# Patient Record
Sex: Female | Born: 1940 | Race: Black or African American | Hispanic: No | Marital: Single | Smoking: Never smoker
Health system: Southern US, Community
[De-identification: ages and names within clinical notes are randomized; demographics above are authoritative.]

## PROBLEM LIST (undated history)

## (undated) DIAGNOSIS — R109 Unspecified abdominal pain: Secondary | ICD-10-CM

## (undated) DIAGNOSIS — K649 Unspecified hemorrhoids: Secondary | ICD-10-CM

## (undated) DIAGNOSIS — F0390 Unspecified dementia without behavioral disturbance: Secondary | ICD-10-CM

## (undated) DIAGNOSIS — K219 Gastro-esophageal reflux disease without esophagitis: Secondary | ICD-10-CM

## (undated) DIAGNOSIS — E119 Type 2 diabetes mellitus without complications: Secondary | ICD-10-CM

## (undated) DIAGNOSIS — C801 Malignant (primary) neoplasm, unspecified: Secondary | ICD-10-CM

## (undated) DIAGNOSIS — D649 Anemia, unspecified: Secondary | ICD-10-CM

## (undated) DIAGNOSIS — I639 Cerebral infarction, unspecified: Secondary | ICD-10-CM

## (undated) HISTORY — PX: TOTAL ABDOMINAL HYSTERECTOMY: SHX209

## (undated) HISTORY — DX: Unspecified hemorrhoids: K64.9

## (undated) HISTORY — PX: OTHER SURGICAL HISTORY: SHX169

## (undated) HISTORY — DX: Gastro-esophageal reflux disease without esophagitis: K21.9

## (undated) HISTORY — DX: Unspecified dementia, unspecified severity, without behavioral disturbance, psychotic disturbance, mood disturbance, and anxiety: F03.90

## (undated) HISTORY — PX: CHOLECYSTECTOMY: SHX55

## (undated) HISTORY — DX: Type 2 diabetes mellitus without complications: E11.9

## (undated) HISTORY — DX: Cerebral infarction, unspecified: I63.9

## (undated) HISTORY — DX: Unspecified abdominal pain: R10.9

## (undated) HISTORY — DX: Anemia, unspecified: D64.9

## (undated) HISTORY — DX: Malignant (primary) neoplasm, unspecified: C80.1

---

## 2003-12-26 ENCOUNTER — Ambulatory Visit: Payer: Self-pay | Admitting: Urology

## 2004-02-09 ENCOUNTER — Other Ambulatory Visit: Payer: Self-pay

## 2004-02-09 ENCOUNTER — Inpatient Hospital Stay: Payer: Self-pay | Admitting: Internal Medicine

## 2004-02-10 ENCOUNTER — Other Ambulatory Visit: Payer: Self-pay

## 2004-05-14 ENCOUNTER — Ambulatory Visit: Payer: Self-pay | Admitting: Urology

## 2004-08-15 ENCOUNTER — Ambulatory Visit: Payer: Self-pay | Admitting: Internal Medicine

## 2004-08-24 ENCOUNTER — Ambulatory Visit: Payer: Self-pay | Admitting: Internal Medicine

## 2004-11-08 ENCOUNTER — Ambulatory Visit: Payer: Self-pay | Admitting: Urology

## 2005-01-03 ENCOUNTER — Ambulatory Visit: Payer: Self-pay | Admitting: Internal Medicine

## 2005-01-16 ENCOUNTER — Ambulatory Visit: Payer: Self-pay | Admitting: Internal Medicine

## 2005-06-10 ENCOUNTER — Ambulatory Visit: Payer: Self-pay | Admitting: Urology

## 2006-01-28 ENCOUNTER — Ambulatory Visit: Payer: Self-pay

## 2006-08-01 ENCOUNTER — Ambulatory Visit: Payer: Self-pay

## 2006-08-21 ENCOUNTER — Ambulatory Visit: Payer: Self-pay | Admitting: Urology

## 2006-09-04 ENCOUNTER — Ambulatory Visit: Payer: Self-pay | Admitting: Urology

## 2006-10-14 ENCOUNTER — Ambulatory Visit: Payer: Self-pay | Admitting: Urology

## 2006-10-14 ENCOUNTER — Other Ambulatory Visit: Payer: Self-pay

## 2006-11-04 ENCOUNTER — Ambulatory Visit: Payer: Self-pay | Admitting: Urology

## 2006-12-29 ENCOUNTER — Ambulatory Visit: Payer: Self-pay | Admitting: Urology

## 2007-04-08 ENCOUNTER — Ambulatory Visit: Payer: Self-pay | Admitting: Urology

## 2007-10-14 ENCOUNTER — Ambulatory Visit: Payer: Self-pay | Admitting: Urology

## 2008-04-13 ENCOUNTER — Ambulatory Visit: Payer: Self-pay | Admitting: Urology

## 2008-11-07 ENCOUNTER — Ambulatory Visit: Payer: Self-pay | Admitting: Urology

## 2009-05-16 ENCOUNTER — Ambulatory Visit: Payer: Self-pay | Admitting: Internal Medicine

## 2009-05-17 ENCOUNTER — Ambulatory Visit: Payer: Self-pay | Admitting: Urology

## 2009-05-20 ENCOUNTER — Inpatient Hospital Stay: Payer: Self-pay | Admitting: Internal Medicine

## 2009-06-08 ENCOUNTER — Ambulatory Visit: Payer: Self-pay | Admitting: Internal Medicine

## 2009-06-16 ENCOUNTER — Ambulatory Visit: Payer: Self-pay | Admitting: Internal Medicine

## 2009-08-08 ENCOUNTER — Ambulatory Visit: Payer: Self-pay | Admitting: Internal Medicine

## 2009-08-16 ENCOUNTER — Ambulatory Visit: Payer: Self-pay | Admitting: Internal Medicine

## 2009-08-17 ENCOUNTER — Ambulatory Visit: Payer: Self-pay | Admitting: Internal Medicine

## 2009-09-15 ENCOUNTER — Ambulatory Visit: Payer: Self-pay | Admitting: Internal Medicine

## 2009-12-16 ENCOUNTER — Ambulatory Visit: Payer: Self-pay | Admitting: Internal Medicine

## 2009-12-28 ENCOUNTER — Ambulatory Visit: Payer: Self-pay | Admitting: Internal Medicine

## 2010-01-16 ENCOUNTER — Ambulatory Visit: Payer: Self-pay | Admitting: Internal Medicine

## 2010-02-20 ENCOUNTER — Ambulatory Visit: Payer: Self-pay | Admitting: Unknown Physician Specialty

## 2010-02-22 LAB — PATHOLOGY REPORT

## 2010-03-29 ENCOUNTER — Ambulatory Visit: Payer: Self-pay | Admitting: Internal Medicine

## 2010-04-18 ENCOUNTER — Ambulatory Visit: Payer: Self-pay | Admitting: Internal Medicine

## 2010-06-28 ENCOUNTER — Ambulatory Visit: Payer: Self-pay | Admitting: Internal Medicine

## 2010-07-17 ENCOUNTER — Ambulatory Visit: Payer: Self-pay

## 2010-07-17 ENCOUNTER — Ambulatory Visit: Payer: Self-pay | Admitting: Internal Medicine

## 2010-09-13 ENCOUNTER — Ambulatory Visit: Payer: Self-pay | Admitting: Internal Medicine

## 2010-09-16 ENCOUNTER — Ambulatory Visit: Payer: Self-pay | Admitting: Internal Medicine

## 2010-12-13 ENCOUNTER — Ambulatory Visit: Payer: Self-pay | Admitting: Internal Medicine

## 2010-12-17 ENCOUNTER — Ambulatory Visit: Payer: Self-pay | Admitting: Internal Medicine

## 2011-02-28 ENCOUNTER — Ambulatory Visit: Payer: Self-pay | Admitting: Internal Medicine

## 2011-03-11 ENCOUNTER — Ambulatory Visit: Payer: Self-pay

## 2011-03-19 ENCOUNTER — Ambulatory Visit: Payer: Self-pay | Admitting: Internal Medicine

## 2011-08-15 ENCOUNTER — Ambulatory Visit: Payer: Self-pay | Admitting: Internal Medicine

## 2011-08-15 LAB — IRON AND TIBC
Iron Bind.Cap.(Total): 264 ug/dL (ref 250–450)
Iron: 79 ug/dL (ref 50–170)
Unbound Iron-Bind.Cap.: 185 ug/dL

## 2011-08-15 LAB — CBC CANCER CENTER
Basophil %: 0.9 %
Eosinophil #: 0.5 x10 3/mm (ref 0.0–0.7)
Eosinophil %: 9.2 %
HGB: 11.9 g/dL — ABNORMAL LOW (ref 12.0–16.0)
MCH: 30.4 pg (ref 26.0–34.0)
MCHC: 32.3 g/dL (ref 32.0–36.0)
MCV: 94 fL (ref 80–100)
Monocyte #: 0.5 x10 3/mm (ref 0.2–0.9)
RBC: 3.91 10*6/uL (ref 3.80–5.20)
WBC: 5.3 x10 3/mm (ref 3.6–11.0)

## 2011-08-17 ENCOUNTER — Ambulatory Visit: Payer: Self-pay | Admitting: Internal Medicine

## 2011-10-28 ENCOUNTER — Ambulatory Visit: Payer: Self-pay | Admitting: Internal Medicine

## 2011-10-28 LAB — CBC CANCER CENTER
Basophil #: 0.1 x10 3/mm (ref 0.0–0.1)
Eosinophil #: 0.8 x10 3/mm — ABNORMAL HIGH (ref 0.0–0.7)
HCT: 34.3 % — ABNORMAL LOW (ref 35.0–47.0)
Lymphocyte #: 2 x10 3/mm (ref 1.0–3.6)
Lymphocyte %: 37.1 %
MCHC: 32.8 g/dL (ref 32.0–36.0)
MCV: 95 fL (ref 80–100)
Monocyte #: 0.4 x10 3/mm (ref 0.2–0.9)
Neutrophil #: 2.2 x10 3/mm (ref 1.4–6.5)
Neutrophil %: 39.6 %
Platelet: 205 x10 3/mm (ref 150–440)
RDW: 13.2 % (ref 11.5–14.5)

## 2011-10-28 LAB — IRON AND TIBC
Iron Bind.Cap.(Total): 272 ug/dL (ref 250–450)
Iron Saturation: 27 %
Unbound Iron-Bind.Cap.: 199 ug/dL

## 2011-11-17 ENCOUNTER — Ambulatory Visit: Payer: Self-pay | Admitting: Internal Medicine

## 2012-02-08 ENCOUNTER — Emergency Department: Payer: Self-pay | Admitting: Emergency Medicine

## 2012-02-08 LAB — COMPREHENSIVE METABOLIC PANEL
Albumin: 3.6 g/dL (ref 3.4–5.0)
Alkaline Phosphatase: 69 U/L (ref 50–136)
BUN: 19 mg/dL — ABNORMAL HIGH (ref 7–18)
Calcium, Total: 9.2 mg/dL (ref 8.5–10.1)
Co2: 26 mmol/L (ref 21–32)
EGFR (Non-African Amer.): 41 — ABNORMAL LOW
Glucose: 148 mg/dL — ABNORMAL HIGH (ref 65–99)
SGOT(AST): 16 U/L (ref 15–37)
SGPT (ALT): 14 U/L (ref 12–78)
Sodium: 141 mmol/L (ref 136–145)

## 2012-02-08 LAB — CBC
HCT: 36.3 % (ref 35.0–47.0)
MCV: 93 fL (ref 80–100)
Platelet: 215 10*3/uL (ref 150–440)
RBC: 3.88 10*6/uL (ref 3.80–5.20)
WBC: 6.4 10*3/uL (ref 3.6–11.0)

## 2012-02-09 LAB — CK: CK, Total: 89 U/L (ref 21–215)

## 2012-03-18 DIAGNOSIS — K649 Unspecified hemorrhoids: Secondary | ICD-10-CM

## 2012-03-18 HISTORY — DX: Unspecified hemorrhoids: K64.9

## 2012-03-19 ENCOUNTER — Ambulatory Visit: Payer: Self-pay | Admitting: Internal Medicine

## 2012-03-19 LAB — IRON AND TIBC
Iron: 58 ug/dL (ref 50–170)
Unbound Iron-Bind.Cap.: 218 ug/dL

## 2012-03-19 LAB — CBC CANCER CENTER
Basophil %: 1.7 %
Eosinophil %: 14.9 %
Lymphocyte #: 1.3 x10 3/mm (ref 1.0–3.6)
MCH: 30.2 pg (ref 26.0–34.0)
MCHC: 32.7 g/dL (ref 32.0–36.0)
MCV: 92 fL (ref 80–100)
Monocyte #: 0.5 x10 3/mm (ref 0.2–0.9)
Monocyte %: 9 %
Platelet: 221 x10 3/mm (ref 150–440)
RBC: 3.89 10*6/uL (ref 3.80–5.20)
WBC: 5.3 x10 3/mm (ref 3.6–11.0)

## 2012-03-19 LAB — FERRITIN: Ferritin (ARMC): 57 ng/mL (ref 8–388)

## 2012-04-17 ENCOUNTER — Ambulatory Visit: Payer: Self-pay | Admitting: Internal Medicine

## 2012-04-17 LAB — CREATININE, SERUM
Creatinine: 1.24 mg/dL (ref 0.60–1.30)
EGFR (African American): 51 — ABNORMAL LOW
EGFR (Non-African Amer.): 44 — ABNORMAL LOW

## 2012-04-18 ENCOUNTER — Ambulatory Visit: Payer: Self-pay | Admitting: Internal Medicine

## 2012-05-11 ENCOUNTER — Emergency Department: Payer: Self-pay | Admitting: Emergency Medicine

## 2012-05-11 LAB — COMPREHENSIVE METABOLIC PANEL
Albumin: 3.4 g/dL (ref 3.4–5.0)
Anion Gap: 7 (ref 7–16)
BUN: 14 mg/dL (ref 7–18)
Co2: 28 mmol/L (ref 21–32)
Creatinine: 0.87 mg/dL (ref 0.60–1.30)
Glucose: 91 mg/dL (ref 65–99)
Osmolality: 278 (ref 275–301)
Potassium: 3.9 mmol/L (ref 3.5–5.1)
SGOT(AST): 14 U/L — ABNORMAL LOW (ref 15–37)
Sodium: 139 mmol/L (ref 136–145)

## 2012-05-11 LAB — TROPONIN I: Troponin-I: <0.02 ng/mL

## 2012-05-11 LAB — URINALYSIS, COMPLETE
Bilirubin,UR: NEGATIVE
Ketone: NEGATIVE
Nitrite: NEGATIVE
Protein: NEGATIVE

## 2012-05-11 LAB — CBC
HCT: 36.7 % (ref 35.0–47.0)
MCH: 29.4 pg (ref 26.0–34.0)
MCHC: 31.9 g/dL — ABNORMAL LOW (ref 32.0–36.0)
Platelet: 249 10*3/uL (ref 150–440)
RDW: 12.9 % (ref 11.5–14.5)
WBC: 5.6 10*3/uL (ref 3.6–11.0)

## 2012-05-11 LAB — CK TOTAL AND CKMB (NOT AT ARMC): CK-MB: <0.5 ng/mL — ABNORMAL LOW (ref 0.5–3.6)

## 2012-05-12 ENCOUNTER — Other Ambulatory Visit: Payer: Self-pay | Admitting: Family

## 2012-05-12 LAB — SEDIMENTATION RATE: Erythrocyte Sed Rate: 6 mm/hr (ref 0–30)

## 2012-05-13 LAB — URINE CULTURE

## 2012-05-20 ENCOUNTER — Ambulatory Visit: Payer: Self-pay | Admitting: Internal Medicine

## 2012-08-20 ENCOUNTER — Ambulatory Visit: Payer: Self-pay | Admitting: Internal Medicine

## 2012-09-15 ENCOUNTER — Ambulatory Visit: Payer: Self-pay | Admitting: Internal Medicine

## 2012-10-06 LAB — CBC CANCER CENTER
Basophil #: 0.1 x10 3/mm (ref 0.0–0.1)
Basophil %: 1.1 %
Eosinophil #: 0.7 x10 3/mm (ref 0.0–0.7)
Eosinophil %: 13.6 %
HGB: 12.4 g/dL (ref 12.0–16.0)
Lymphocyte #: 1.8 x10 3/mm (ref 1.0–3.6)
MCH: 30.8 pg (ref 26.0–34.0)
MCHC: 33.4 g/dL (ref 32.0–36.0)
Neutrophil %: 39.4 %
RDW: 13.8 % (ref 11.5–14.5)

## 2012-10-06 LAB — IRON AND TIBC: Iron Bind.Cap.(Total): 325 ug/dL (ref 250–450)

## 2012-10-16 ENCOUNTER — Ambulatory Visit: Payer: Self-pay | Admitting: Internal Medicine

## 2012-11-02 ENCOUNTER — Encounter: Payer: Self-pay | Admitting: *Deleted

## 2012-11-11 ENCOUNTER — Encounter: Payer: Self-pay | Admitting: General Surgery

## 2012-11-11 ENCOUNTER — Ambulatory Visit (INDEPENDENT_AMBULATORY_CARE_PROVIDER_SITE_OTHER): Payer: Medicare PPO | Admitting: General Surgery

## 2012-11-11 VITALS — BP 130/80 | HR 64 | Resp 12 | Ht 67.0 in | Wt 211.0 lb

## 2012-11-11 DIAGNOSIS — K6289 Other specified diseases of anus and rectum: Secondary | ICD-10-CM | POA: Insufficient documentation

## 2012-11-11 DIAGNOSIS — K439 Ventral hernia without obstruction or gangrene: Secondary | ICD-10-CM

## 2012-11-11 DIAGNOSIS — R102 Pelvic and perineal pain: Secondary | ICD-10-CM

## 2012-11-11 DIAGNOSIS — K649 Unspecified hemorrhoids: Secondary | ICD-10-CM | POA: Insufficient documentation

## 2012-11-11 DIAGNOSIS — N949 Unspecified condition associated with female genital organs and menstrual cycle: Secondary | ICD-10-CM

## 2012-11-11 NOTE — Progress Notes (Signed)
Patient ID: Michele Abbott, female   DOB: Jan 06, 1941, 72 y.o.   MRN: [id number]  Chief Complaint  Patient presents with  . Other    evaluation of hemorrhoids    HPI Michele Abbott is a 72 y.o. female.  Patient here for hemorrhoid evaluation.  States she is having increased rectal pain over past month. Throbbing pain that is constant. Using Tylenol but not helping. No bleeding with BM.  States BM 2-3 times a day.  Since her last visit in February 2014 she has been started on Coumadin for atrial fibrillation and stroke prevention. The patient reports being unaware of any cardiac problems.  The patient denies pain in the vagina or dysuria.  She is accompanied today by her younger sister, Michele Abbott. HPI  Past Medical History  Diagnosis Date  . Hemorrhoid 2014  . GERD (gastroesophageal reflux disease)   . Diabetes mellitus without complication   . Stroke   . Dementia   . Anemia   . Abdominal pain     Past Surgical History  Procedure Laterality Date  . Total abdominal hysterectomy    . Cholecystectomy      Family History  Problem Relation Age of Onset  . Cancer Sister     ovarian    Social History History  Substance Use Topics  . Smoking status: Never Smoker   . Smokeless tobacco: Not on file  . Alcohol Use: No    Allergies  Allergen Reactions  . Codeine Other (See Comments)    shakey    Current Outpatient Prescriptions  Medication Sig Dispense Refill  . aluminum & magnesium hydroxide-simethicone (MYLANTA) 500-450-40 MG/5ML suspension Take by mouth as needed for indigestion.      . Cholecalciferol (VITAMIN D-3) 1000 UNITS CAPS Take by mouth daily.      . Ferrous Sulfate (IRON) 325 (65 FE) MG TABS Take by mouth daily.      Marland Kitchen olmesartan (BENICAR) 40 MG tablet Take 40 mg by mouth daily.      . potassium chloride (MICRO-K) 10 MEQ CR capsule Take 10 mEq by mouth daily.      . baclofen (LIORESAL) 10 MG tablet Take 5 mg by mouth daily.       Marland Kitchen donepezil (ARICEPT) 5 MG tablet  Take 5 mg by mouth at bedtime.       . metFORMIN (GLUCOPHAGE) 1000 MG tablet 1,000 mg 2 (two) times daily with a meal.       . PRODIGY NO CODING BLOOD GLUC test strip       . ranitidine (ZANTAC) 150 MG tablet 150 mg 2 (two) times daily.       . simvastatin (ZOCOR) 20 MG tablet Take 20 mg by mouth daily.       . traMADol (ULTRAM) 50 MG tablet 50 mg 2 (two) times daily.       Marland Kitchen warfarin (COUMADIN) 5 MG tablet Take 5 mg by mouth daily.        No current facility-administered medications for this visit.    Review of Systems Review of Systems  Constitutional: Negative.   Respiratory: Negative.   Gastrointestinal: Negative.   Neurological: Positive for headaches.    Blood pressure 130/80, pulse 64, resp. rate 12, height 5\' 7"  (1.702 m), weight 211 lb (95.709 kg).  Physical Exam Physical Exam  Constitutional: She is oriented to person, place, and time. She appears well-developed and well-nourished.  Cardiovascular: Normal rate and regular rhythm.   Pulses:      Dorsalis pedis  pulses are 2+ on the right side, and 2+ on the left side.  Pulmonary/Chest: Effort normal and breath sounds normal.  Abdominal: Soft. A hernia is present. Hernia confirmed positive in the ventral area.  Genitourinary: Rectum normal. Rectal exam shows anal tone normal.  Normal sphincter tone appreciated. No pathology of the puborectalis muscle. No point tenderness. No spasm.  Neurological: She is alert and oriented to person, place, and time.  Skin: Skin is warm and dry.  Ventral hernia is perhaps 4 or 5 cm in diameter when examined in the standing position, less prominent when supine.  Data Reviewed 2011 CT scan: No pelvic pathology identified.  Anoscopy was completed today. The visualized lower rectal mucosa was normal. No internal or external hemorrhoids appreciated. Previously suspected fissure no longer evident.  Assessment    Persistent pelvic pain.  Ventral hernia.     Plan    Arranges we made for  a CT scan of the abdomen and pelvis to assess both the hernia (unlikely source for her present symptoms) and the pelvis to assess for source of her reported pain. At this time, no pathologic process as indicated.     Patient has been scheduled for a CT abdomen/pelvis with contrast at [facility] for 11-17-12 at 3:30 pm (arrive 3 pm). Prep: no solids 4 hours prior but patient may have clear liquids up until exam time, pick up prep kit, and take medication list. She will not make use of metformin day of scan and hold 48 hours after. Patient verbalizes understanding.  Patient to follow up in the office after CT scan completed.   Michele Abbott 11/11/2012, 10:10 PM

## 2012-11-11 NOTE — Patient Instructions (Addendum)
Patient has been scheduled for a CT abdomen/pelvis with contrast at [facility] for 11-17-12 at 3:30 pm (arrive 3 pm). Prep: no solids 4 hours prior but patient may have clear liquids up until exam time, pick up prep kit, and take medication list. She will not make use of metformin day of scan and hold 48 hours after. Patient verbalizes understanding.  Patient to follow up in the office after CT scan completed.

## 2012-11-17 ENCOUNTER — Other Ambulatory Visit: Payer: Self-pay

## 2012-11-17 ENCOUNTER — Ambulatory Visit: Payer: Self-pay | Admitting: General Surgery

## 2012-11-17 DIAGNOSIS — K439 Ventral hernia without obstruction or gangrene: Secondary | ICD-10-CM

## 2012-11-18 ENCOUNTER — Encounter: Payer: Self-pay | Admitting: General Surgery

## 2012-11-23 ENCOUNTER — Encounter: Payer: Self-pay | Admitting: General Surgery

## 2012-11-23 ENCOUNTER — Ambulatory Visit (INDEPENDENT_AMBULATORY_CARE_PROVIDER_SITE_OTHER): Payer: Medicare PPO | Admitting: General Surgery

## 2012-11-23 VITALS — BP 130/90 | HR 64 | Resp 16 | Ht 67.0 in | Wt 216.0 lb

## 2012-11-23 DIAGNOSIS — R102 Pelvic and perineal pain: Secondary | ICD-10-CM

## 2012-11-23 DIAGNOSIS — N949 Unspecified condition associated with female genital organs and menstrual cycle: Secondary | ICD-10-CM

## 2012-11-23 DIAGNOSIS — K6289 Other specified diseases of anus and rectum: Secondary | ICD-10-CM

## 2012-11-23 NOTE — Progress Notes (Signed)
Patient ID: Michele Abbott, female   DOB: [birth year], 72 y.o.   MRN: [id number]  Chief Complaint  Patient presents with  . Follow-up    CT abd/pelvis     HPI Michele Abbott is a 72 y.o. female who presents for a follow up CT of the abdomen/pelvis. The CT was done on 11/17/12 at [facility].  The patient continues to report pain pneumoperitoneum. No bleeding. She reports he she is not experiencing pain in the left thigh. The patient is accompanied today by her sister who is present for the interview and exam. HPI  Past Medical History  Diagnosis Date  . Hemorrhoid 2014  . GERD (gastroesophageal reflux disease)   . Diabetes mellitus without complication   . Stroke   . Dementia   . Anemia   . Abdominal pain   . Cancer 2008-2009?    kidney    Past Surgical History  Procedure Laterality Date  . Total abdominal hysterectomy    . Cholecystectomy    . Cyrotherapy      Kidney    Family History  Problem Relation Age of Onset  . Cancer Sister     ovarian    Social History History  Substance Use Topics  . Smoking status: Never Smoker   . Smokeless tobacco: Not on file  . Alcohol Use: No    Allergies  Allergen Reactions  . Codeine Other (See Comments)    shakey    Current Outpatient Prescriptions  Medication Sig Dispense Refill  . aluminum & magnesium hydroxide-simethicone (MYLANTA) 500-450-40 MG/5ML suspension Take by mouth as needed for indigestion.      . baclofen (LIORESAL) 10 MG tablet Take 5 mg by mouth daily.       . Cholecalciferol (VITAMIN D-3) 1000 UNITS CAPS Take by mouth daily.      Marland Kitchen donepezil (ARICEPT) 5 MG tablet Take 5 mg by mouth at bedtime.       . Ferrous Sulfate (IRON) 325 (65 FE) MG TABS Take by mouth daily.      . metFORMIN (GLUCOPHAGE) 1000 MG tablet 1,000 mg 2 (two) times daily with a meal.       . olmesartan (BENICAR) 40 MG tablet Take 40 mg by mouth daily.      . potassium chloride (MICRO-K) 10 MEQ CR capsule Take 10 mEq by mouth daily.      Marland Kitchen PRODIGY  NO CODING BLOOD GLUC test strip       . ranitidine (ZANTAC) 150 MG tablet 150 mg 2 (two) times daily.       . simvastatin (ZOCOR) 20 MG tablet Take 20 mg by mouth daily.       . traMADol (ULTRAM) 50 MG tablet 50 mg 2 (two) times daily.       Marland Kitchen warfarin (COUMADIN) 5 MG tablet Take 5 mg by mouth daily.        No current facility-administered medications for this visit.    Review of Systems Review of Systems  Constitutional: Negative.   Respiratory: Negative.   Cardiovascular: Negative.   Gastrointestinal: Negative.     Blood pressure 130/90, pulse 64, resp. rate 16, height 5\' 7"  (1.702 m), weight 216 lb (97.977 kg).  Physical Exam Physical Exam  Constitutional: She is oriented to person, place, and time. She appears well-developed and well-nourished.  Abdominal: Soft. Normal appearance.    Neurological: She is alert and oriented to person, place, and time.  Skin: Skin is warm and dry.     Data Reviewed  CT of the abdomen and pelvis dated 11/17/2012 reviewed. Findings consistent for polycystic kidney disease. Possible residual malignancy is possible. Large ventral hernia without evidence of dilatation or obstruction. Special attention to the pelvis showed no discernible abnormality of the muscular structure, no lymphadenopathy.  Assessment    Pelvic pain, no clear etiology.  Asymptomatic ventral hernia.  Questionable interval change on CT status post cryotherapy of a right renal cancer.    Plan    The patient had previously been treated by Edrick Oh, M.D. She will take responsibility for ranging a followup assessment.  In regards to her pelvic symptoms no additional recommendations are available this time, as no pathology is identified.  Her ventral hernia is long-standing asymptomatic. No intervention is required at this time.       [name] 11/24/2012, 7:48 AM

## 2012-11-23 NOTE — Patient Instructions (Signed)
Patient to return as needed. 

## 2012-11-24 ENCOUNTER — Ambulatory Visit: Payer: Self-pay | Admitting: Nurse Practitioner

## 2012-12-22 ENCOUNTER — Ambulatory Visit: Payer: Self-pay | Admitting: Internal Medicine

## 2013-05-26 ENCOUNTER — Emergency Department: Payer: Self-pay | Admitting: Emergency Medicine

## 2013-05-26 LAB — COMPREHENSIVE METABOLIC PANEL
ALBUMIN: 3.3 g/dL — AB (ref 3.4–5.0)
ALT: 9 U/L — AB (ref 12–78)
ANION GAP: 7 (ref 7–16)
AST: 5 U/L — AB (ref 15–37)
Alkaline Phosphatase: 72 U/L
BUN: 14 mg/dL (ref 7–18)
Bilirubin,Total: 0.5 mg/dL (ref 0.2–1.0)
CALCIUM: 9.6 mg/dL (ref 8.5–10.1)
CREATININE: 1.08 mg/dL (ref 0.60–1.30)
Chloride: 104 mmol/L (ref 98–107)
Co2: 29 mmol/L (ref 21–32)
EGFR (African American): 60 — ABNORMAL LOW
GFR CALC NON AF AMER: 52 — AB
GLUCOSE: 139 mg/dL — AB (ref 65–99)
Osmolality: 282 (ref 275–301)
POTASSIUM: 4.2 mmol/L (ref 3.5–5.1)
SODIUM: 140 mmol/L (ref 136–145)
Total Protein: 6.7 g/dL (ref 6.4–8.2)

## 2013-05-26 LAB — URINALYSIS, COMPLETE
Bacteria: NONE SEEN
Bilirubin,UR: NEGATIVE
GLUCOSE, UR: NEGATIVE mg/dL (ref 0–75)
Ketone: NEGATIVE
Nitrite: NEGATIVE
Ph: 7 (ref 4.5–8.0)
SPECIFIC GRAVITY: 1.015 (ref 1.003–1.030)
Squamous Epithelial: <1
WBC UR: 48 /HPF (ref 0–5)

## 2013-05-26 LAB — TROPONIN I: Troponin-I: <0.02 ng/mL

## 2013-05-26 LAB — CBC
HCT: 36.8 % (ref 35.0–47.0)
HGB: 11.7 g/dL — AB (ref 12.0–16.0)
MCH: 30.3 pg (ref 26.0–34.0)
MCHC: 31.7 g/dL — AB (ref 32.0–36.0)
MCV: 95 fL (ref 80–100)
Platelet: 263 10*3/uL (ref 150–440)
RBC: 3.86 10*6/uL (ref 3.80–5.20)
RDW: 13.8 % (ref 11.5–14.5)
WBC: 7.7 10*3/uL (ref 3.6–11.0)

## 2013-05-26 LAB — CK TOTAL AND CKMB (NOT AT ARMC)
CK, Total: 609 U/L — ABNORMAL HIGH
CK-MB: 0.8 ng/mL (ref 0.5–3.6)

## 2013-05-28 LAB — URINE CULTURE

## 2013-08-24 ENCOUNTER — Ambulatory Visit: Payer: Self-pay | Admitting: Internal Medicine

## 2014-01-17 ENCOUNTER — Encounter: Payer: Self-pay | Admitting: General Surgery

## 2014-05-03 ENCOUNTER — Ambulatory Visit: Payer: Self-pay | Admitting: Internal Medicine

## 2014-11-19 IMAGING — CT CT ABD-PELV W/O CM
1 of 3 series · 13 of 32 positions shown, 18 images · non-contrast
Comparison: none

REASON FOR EXAM: Labs 1st ventral hernia   pelvic pain
COMMENTS:

[Series 2: abd 3mm wo 3.0 i40f 3 · axial · 0.98mm/px · z∈[-1156,-772]mm · 13 of 144 slices shown, 18 images]
[im 8/144  soft-tissue]
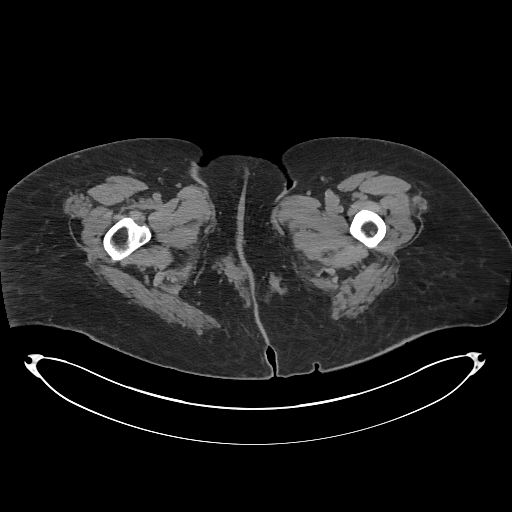
[im 8/144  bone]
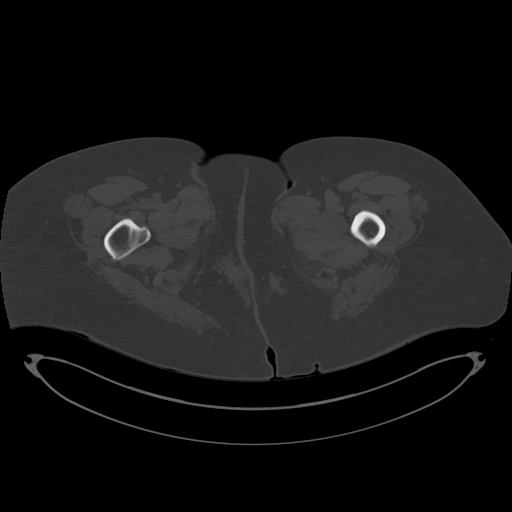
[im 22/144  soft-tissue]
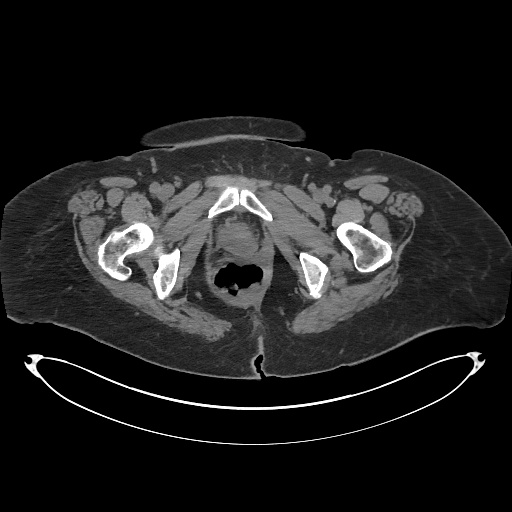
[im 29/144  soft-tissue]
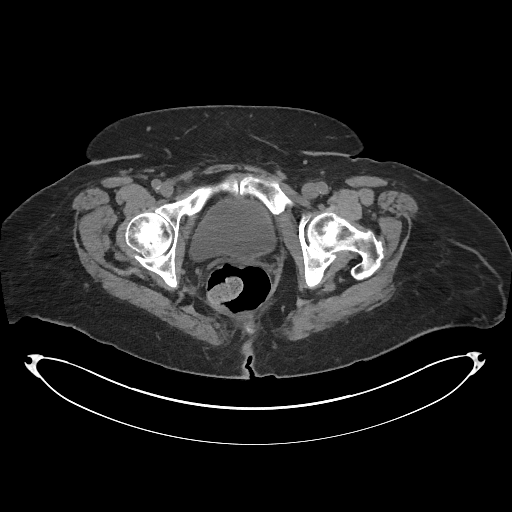
[im 43/144  soft-tissue]
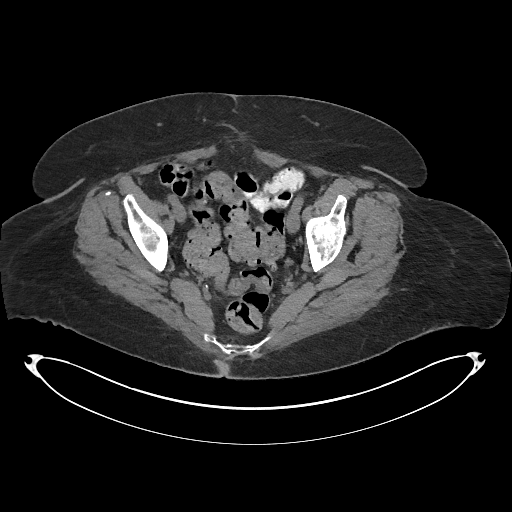
[im 58/144  soft-tissue]
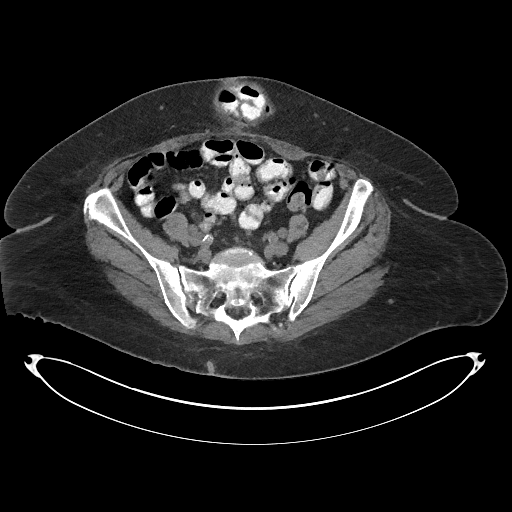
[im 65/144  soft-tissue]
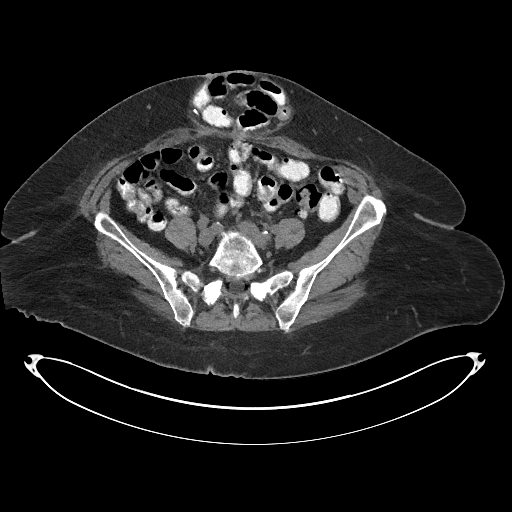
[im 79/144  soft-tissue]
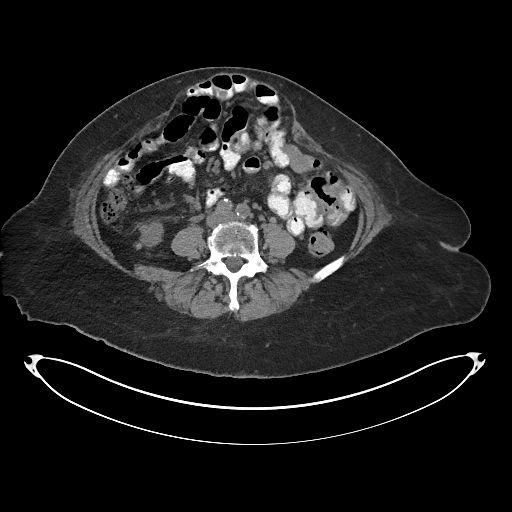
[im 86/144  soft-tissue]
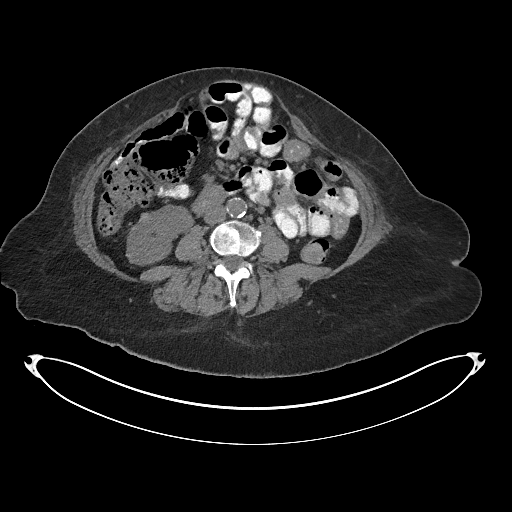
[im 101/144  soft-tissue]
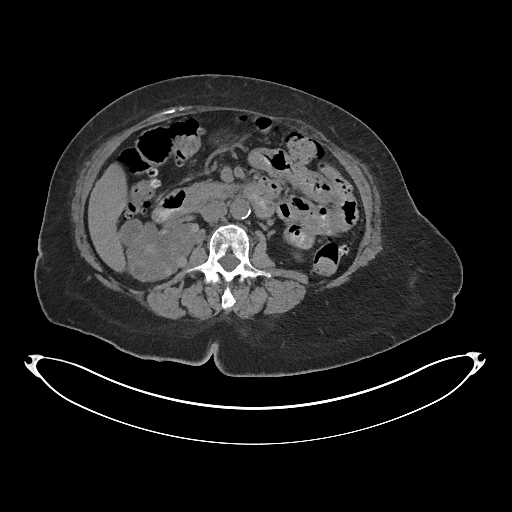
[im 101/144  bone]
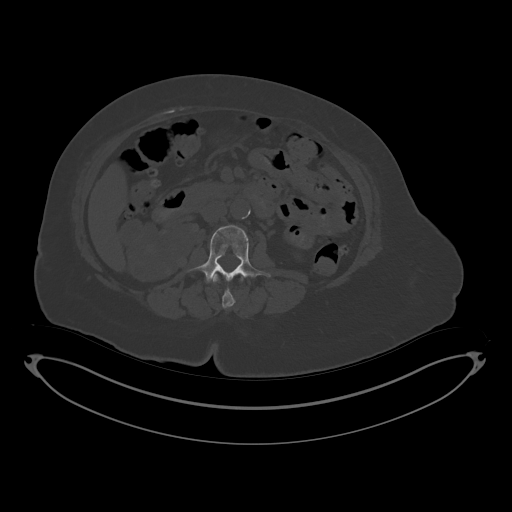
[im 115/144  soft-tissue]
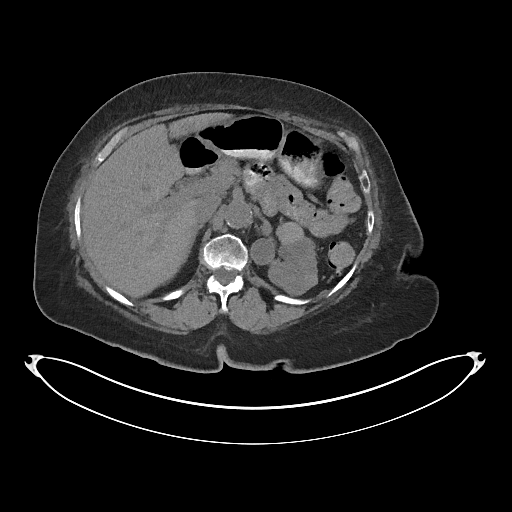
[im 115/144  lung]
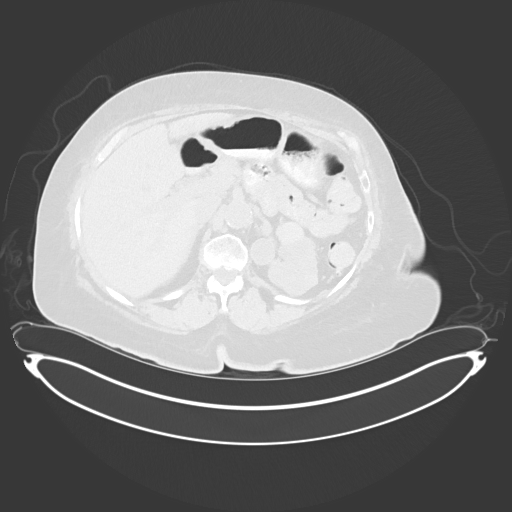
[im 122/144  soft-tissue]
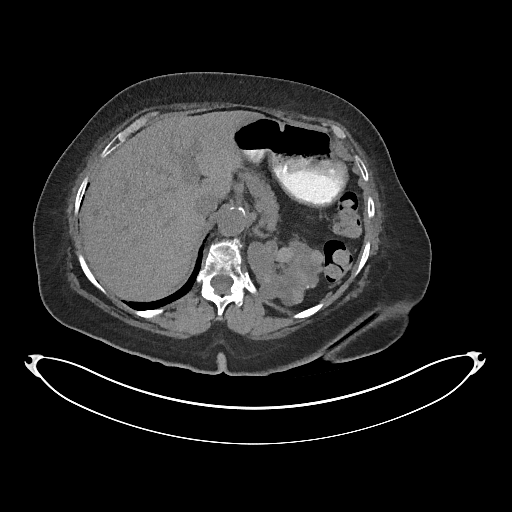
[im 122/144  lung]
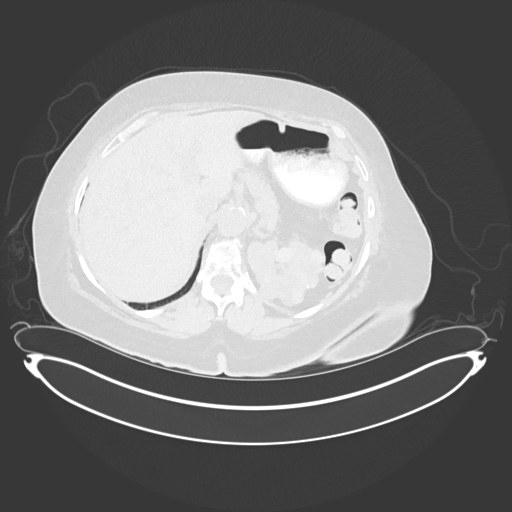
[im 129/144  lung]
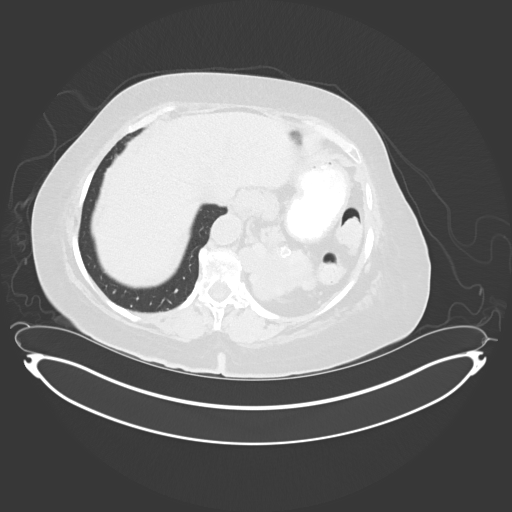
[im 136/144  soft-tissue]
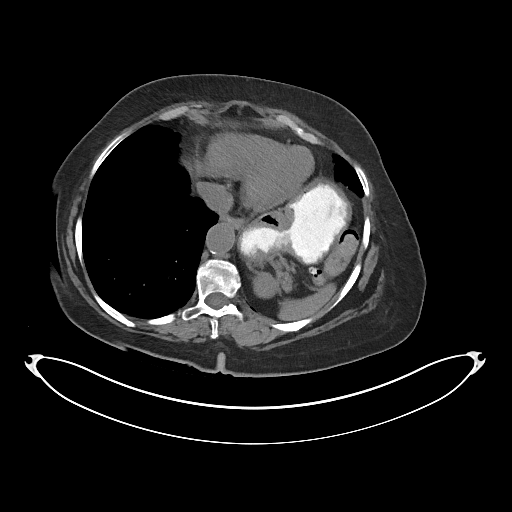
[im 136/144  lung]
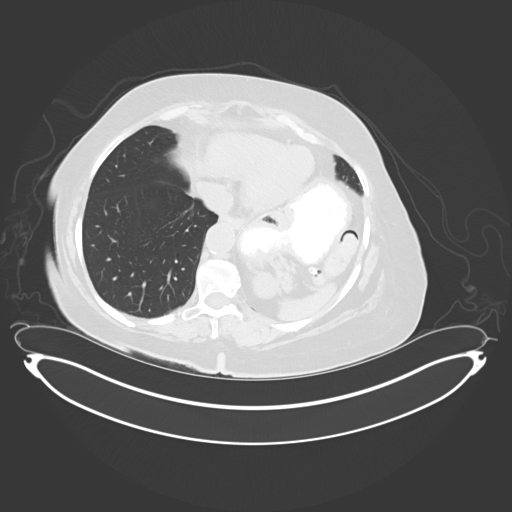

[13 of 32 positions shown; findings below may reference images not displayed]

PROCEDURE:     KCT - KCT ABDOMEN/PELVIS WO  - November 17, 2012  [DATE]

RESULT:     CT of the abdomen and pelvis is performed with oral contrast. IV
contrast was not utilized because the patient's poor renal function. Images
are reconstructed at 3.0 mm slice thickness in the axial plane. Comparison
is made to the previous study of 08/08/2009.

There is mild elevation of left hemidiaphragm. The kidneys are abnormal in
appearance with multiple cortical areas of nodular density of different
attenuation characteristics with some which could represent hyperdense
masses or solid masses and others suggestive of cystic masses. There is
calcification in the upper pole the right kidney posteriorly. The
possibility of malignancy in these lesions is not excluded. Additional
calcification is seen in the upper pole the left kidney in 2 foci.
Cholecystectomy clips are present. Noncontrast images of the liver and
spleen appear to be unremarkable. The stomach appears within normal limits.
The pancreas is unremarkable. There is a moderately large amount of fecal
material in the sigmoid colon and right colon. There is a large ventral
hernia measuring is much as 11.9 cm transversely on image 70. This appears
to contain loops of small bowel and a portion of transverse colon without
obstruction or wall thickening. The urinary bladder contains a moderate
amount of urine and appears unremarkable. The uterus appears to been
removed. No adnexal masses appreciated. The bony structures appear to be
grossly normal. The aorta is normal in caliber with atherosclerotic
calcification demonstrated. The adrenal glands appear unremarkable.
IMPRESSION: 1. Findings concerning for polycystic kidney disease. Given the underlying
history of renal malignancy the possibility of residual malignancy is not
excluded. Nodular densities are present in both kidneys of varying
attenuation characteristics. Some could represent hemorrhagic cysts although
the possibility of other hyperdense cystic lesions or solid lesions is not
excluded on this noncontrast study. Areas of calcification are present in
both kidneys. The
2. Large ventral hernia as described containing loops of small bowel
predominantly with the transverse colon seen along the superior margin not
extending dramatically far into the hernia. There is no obstruction. There
is a moderate amount of fecal material in the colon. Scattered
diverticulosis is present without evidence of diverticulitis.
3. It appears the patient is status post cholecystectomy and hysterectomy.
Atherosclerotic disease is noted.

[REDACTED]

## 2017-02-25 DIAGNOSIS — K219 Gastro-esophageal reflux disease without esophagitis: Secondary | ICD-10-CM | POA: Diagnosis not present

## 2017-02-25 DIAGNOSIS — L02219 Cutaneous abscess of trunk, unspecified: Secondary | ICD-10-CM | POA: Diagnosis not present

## 2017-02-25 DIAGNOSIS — I6990 Unspecified sequelae of unspecified cerebrovascular disease: Secondary | ICD-10-CM | POA: Diagnosis not present

## 2017-02-25 DIAGNOSIS — E119 Type 2 diabetes mellitus without complications: Secondary | ICD-10-CM | POA: Diagnosis not present

## 2017-05-26 DIAGNOSIS — I209 Angina pectoris, unspecified: Secondary | ICD-10-CM | POA: Diagnosis not present

## 2017-05-26 DIAGNOSIS — N182 Chronic kidney disease, stage 2 (mild): Secondary | ICD-10-CM | POA: Diagnosis not present

## 2017-05-26 DIAGNOSIS — R413 Other amnesia: Secondary | ICD-10-CM | POA: Diagnosis not present

## 2017-05-26 DIAGNOSIS — E119 Type 2 diabetes mellitus without complications: Secondary | ICD-10-CM | POA: Diagnosis not present

## 2017-08-26 DIAGNOSIS — K210 Gastro-esophageal reflux disease with esophagitis: Secondary | ICD-10-CM | POA: Diagnosis not present

## 2017-08-26 DIAGNOSIS — M25851 Other specified joint disorders, right hip: Secondary | ICD-10-CM | POA: Diagnosis not present

## 2017-08-26 DIAGNOSIS — E119 Type 2 diabetes mellitus without complications: Secondary | ICD-10-CM | POA: Diagnosis not present

## 2017-08-26 DIAGNOSIS — Z885 Allergy status to narcotic agent status: Secondary | ICD-10-CM | POA: Diagnosis not present

## 2018-04-17 DIAGNOSIS — E119 Type 2 diabetes mellitus without complications: Secondary | ICD-10-CM | POA: Diagnosis not present

## 2018-04-17 DIAGNOSIS — D649 Anemia, unspecified: Secondary | ICD-10-CM | POA: Diagnosis not present

## 2018-04-17 DIAGNOSIS — I10 Essential (primary) hypertension: Secondary | ICD-10-CM | POA: Diagnosis not present

## 2018-04-17 DIAGNOSIS — M25851 Other specified joint disorders, right hip: Secondary | ICD-10-CM | POA: Diagnosis not present

## 2018-04-17 DIAGNOSIS — R5381 Other malaise: Secondary | ICD-10-CM | POA: Diagnosis not present

## 2018-04-17 DIAGNOSIS — K210 Gastro-esophageal reflux disease with esophagitis: Secondary | ICD-10-CM | POA: Diagnosis not present

## 2018-04-22 DIAGNOSIS — I209 Angina pectoris, unspecified: Secondary | ICD-10-CM | POA: Diagnosis not present

## 2018-04-22 DIAGNOSIS — K219 Gastro-esophageal reflux disease without esophagitis: Secondary | ICD-10-CM | POA: Diagnosis not present

## 2018-04-22 DIAGNOSIS — N184 Chronic kidney disease, stage 4 (severe): Secondary | ICD-10-CM | POA: Diagnosis not present

## 2018-04-22 DIAGNOSIS — R413 Other amnesia: Secondary | ICD-10-CM | POA: Diagnosis not present

## 2018-05-05 DIAGNOSIS — R8290 Unspecified abnormal findings in urine: Secondary | ICD-10-CM | POA: Diagnosis not present

## 2018-05-05 DIAGNOSIS — N184 Chronic kidney disease, stage 4 (severe): Secondary | ICD-10-CM | POA: Diagnosis not present

## 2018-05-05 DIAGNOSIS — E1122 Type 2 diabetes mellitus with diabetic chronic kidney disease: Secondary | ICD-10-CM | POA: Diagnosis not present

## 2018-05-05 DIAGNOSIS — I10 Essential (primary) hypertension: Secondary | ICD-10-CM | POA: Diagnosis not present

## 2018-05-05 DIAGNOSIS — D631 Anemia in chronic kidney disease: Secondary | ICD-10-CM | POA: Diagnosis not present

## 2018-05-05 DIAGNOSIS — I129 Hypertensive chronic kidney disease with stage 1 through stage 4 chronic kidney disease, or unspecified chronic kidney disease: Secondary | ICD-10-CM | POA: Diagnosis not present

## 2018-05-21 DIAGNOSIS — N184 Chronic kidney disease, stage 4 (severe): Secondary | ICD-10-CM | POA: Diagnosis not present

## 2018-05-21 DIAGNOSIS — N2881 Hypertrophy of kidney: Secondary | ICD-10-CM | POA: Diagnosis not present

## 2018-05-21 DIAGNOSIS — N281 Cyst of kidney, acquired: Secondary | ICD-10-CM | POA: Diagnosis not present

## 2018-05-21 DIAGNOSIS — N289 Disorder of kidney and ureter, unspecified: Secondary | ICD-10-CM | POA: Diagnosis not present

## 2018-05-21 DIAGNOSIS — N329 Bladder disorder, unspecified: Secondary | ICD-10-CM | POA: Diagnosis not present

## 2018-05-21 DIAGNOSIS — R9341 Abnormal radiologic findings on diagnostic imaging of renal pelvis, ureter, or bladder: Secondary | ICD-10-CM | POA: Diagnosis not present

## 2018-05-21 DIAGNOSIS — R944 Abnormal results of kidney function studies: Secondary | ICD-10-CM | POA: Diagnosis not present

## 2018-07-09 DIAGNOSIS — E1122 Type 2 diabetes mellitus with diabetic chronic kidney disease: Secondary | ICD-10-CM | POA: Diagnosis not present

## 2018-07-09 DIAGNOSIS — N184 Chronic kidney disease, stage 4 (severe): Secondary | ICD-10-CM | POA: Diagnosis not present

## 2018-07-09 DIAGNOSIS — N281 Cyst of kidney, acquired: Secondary | ICD-10-CM | POA: Diagnosis not present

## 2018-07-09 DIAGNOSIS — N329 Bladder disorder, unspecified: Secondary | ICD-10-CM | POA: Diagnosis not present

## 2018-07-09 DIAGNOSIS — D631 Anemia in chronic kidney disease: Secondary | ICD-10-CM | POA: Diagnosis not present

## 2018-07-09 DIAGNOSIS — I129 Hypertensive chronic kidney disease with stage 1 through stage 4 chronic kidney disease, or unspecified chronic kidney disease: Secondary | ICD-10-CM | POA: Diagnosis not present

## 2018-08-04 DIAGNOSIS — I10 Essential (primary) hypertension: Secondary | ICD-10-CM | POA: Diagnosis not present

## 2018-08-04 DIAGNOSIS — S0101XA Laceration without foreign body of scalp, initial encounter: Secondary | ICD-10-CM | POA: Diagnosis not present

## 2018-08-04 DIAGNOSIS — Z1159 Encounter for screening for other viral diseases: Secondary | ICD-10-CM | POA: Diagnosis not present

## 2018-08-04 DIAGNOSIS — W19XXXA Unspecified fall, initial encounter: Secondary | ICD-10-CM | POA: Diagnosis not present

## 2018-08-04 DIAGNOSIS — E119 Type 2 diabetes mellitus without complications: Secondary | ICD-10-CM | POA: Diagnosis not present

## 2018-08-04 DIAGNOSIS — R58 Hemorrhage, not elsewhere classified: Secondary | ICD-10-CM | POA: Diagnosis not present

## 2018-08-04 DIAGNOSIS — F0390 Unspecified dementia without behavioral disturbance: Secondary | ICD-10-CM | POA: Diagnosis not present

## 2018-08-04 DIAGNOSIS — W0110XA Fall on same level from slipping, tripping and stumbling with subsequent striking against unspecified object, initial encounter: Secondary | ICD-10-CM | POA: Diagnosis not present

## 2018-08-04 DIAGNOSIS — Z886 Allergy status to analgesic agent status: Secondary | ICD-10-CM | POA: Diagnosis not present

## 2018-08-04 DIAGNOSIS — R52 Pain, unspecified: Secondary | ICD-10-CM | POA: Diagnosis not present

## 2018-08-04 DIAGNOSIS — S0990XA Unspecified injury of head, initial encounter: Secondary | ICD-10-CM | POA: Diagnosis not present

## 2018-08-14 DIAGNOSIS — D649 Anemia, unspecified: Secondary | ICD-10-CM | POA: Diagnosis not present

## 2018-08-14 DIAGNOSIS — M25851 Other specified joint disorders, right hip: Secondary | ICD-10-CM | POA: Diagnosis not present

## 2018-08-14 DIAGNOSIS — E1151 Type 2 diabetes mellitus with diabetic peripheral angiopathy without gangrene: Secondary | ICD-10-CM | POA: Diagnosis not present

## 2018-08-14 DIAGNOSIS — E119 Type 2 diabetes mellitus without complications: Secondary | ICD-10-CM | POA: Diagnosis not present

## 2018-08-26 DIAGNOSIS — I32 Pericarditis in diseases classified elsewhere: Secondary | ICD-10-CM | POA: Diagnosis not present

## 2018-08-26 DIAGNOSIS — E1151 Type 2 diabetes mellitus with diabetic peripheral angiopathy without gangrene: Secondary | ICD-10-CM | POA: Diagnosis not present

## 2018-08-26 DIAGNOSIS — I6990 Unspecified sequelae of unspecified cerebrovascular disease: Secondary | ICD-10-CM | POA: Diagnosis not present

## 2018-08-26 DIAGNOSIS — D649 Anemia, unspecified: Secondary | ICD-10-CM | POA: Diagnosis not present

## 2018-09-08 DIAGNOSIS — E1122 Type 2 diabetes mellitus with diabetic chronic kidney disease: Secondary | ICD-10-CM | POA: Diagnosis not present

## 2018-09-08 DIAGNOSIS — N281 Cyst of kidney, acquired: Secondary | ICD-10-CM | POA: Diagnosis not present

## 2018-09-08 DIAGNOSIS — N184 Chronic kidney disease, stage 4 (severe): Secondary | ICD-10-CM | POA: Diagnosis not present

## 2018-09-08 DIAGNOSIS — I129 Hypertensive chronic kidney disease with stage 1 through stage 4 chronic kidney disease, or unspecified chronic kidney disease: Secondary | ICD-10-CM | POA: Diagnosis not present

## 2018-09-08 DIAGNOSIS — D631 Anemia in chronic kidney disease: Secondary | ICD-10-CM | POA: Diagnosis not present

## 2018-09-11 DIAGNOSIS — N184 Chronic kidney disease, stage 4 (severe): Secondary | ICD-10-CM | POA: Diagnosis not present

## 2018-09-11 DIAGNOSIS — N281 Cyst of kidney, acquired: Secondary | ICD-10-CM | POA: Diagnosis not present

## 2018-09-11 DIAGNOSIS — I129 Hypertensive chronic kidney disease with stage 1 through stage 4 chronic kidney disease, or unspecified chronic kidney disease: Secondary | ICD-10-CM | POA: Diagnosis not present

## 2018-09-11 DIAGNOSIS — N2889 Other specified disorders of kidney and ureter: Secondary | ICD-10-CM | POA: Diagnosis not present

## 2018-09-11 DIAGNOSIS — D631 Anemia in chronic kidney disease: Secondary | ICD-10-CM | POA: Diagnosis not present

## 2018-09-11 DIAGNOSIS — E1122 Type 2 diabetes mellitus with diabetic chronic kidney disease: Secondary | ICD-10-CM | POA: Diagnosis not present

## 2018-09-23 DIAGNOSIS — N184 Chronic kidney disease, stage 4 (severe): Secondary | ICD-10-CM | POA: Diagnosis not present

## 2018-09-23 DIAGNOSIS — N281 Cyst of kidney, acquired: Secondary | ICD-10-CM | POA: Diagnosis not present

## 2018-10-12 DIAGNOSIS — N289 Disorder of kidney and ureter, unspecified: Secondary | ICD-10-CM | POA: Diagnosis not present

## 2018-10-12 DIAGNOSIS — N184 Chronic kidney disease, stage 4 (severe): Secondary | ICD-10-CM | POA: Diagnosis not present

## 2018-12-07 ENCOUNTER — Emergency Department: Payer: Medicare PPO

## 2018-12-07 ENCOUNTER — Encounter: Payer: Self-pay | Admitting: Emergency Medicine

## 2018-12-07 ENCOUNTER — Inpatient Hospital Stay
Admission: EM | Admit: 2018-12-07 | Discharge: 2018-12-09 | DRG: 872 | Disposition: A | Discharge status: Hospice | Payer: Medicare PPO | Attending: Internal Medicine | Admitting: Internal Medicine

## 2018-12-07 ENCOUNTER — Other Ambulatory Visit: Payer: Self-pay

## 2018-12-07 DIAGNOSIS — R404 Transient alteration of awareness: Secondary | ICD-10-CM | POA: Diagnosis not present

## 2018-12-07 DIAGNOSIS — E1165 Type 2 diabetes mellitus with hyperglycemia: Secondary | ICD-10-CM | POA: Diagnosis not present

## 2018-12-07 DIAGNOSIS — N3001 Acute cystitis with hematuria: Secondary | ICD-10-CM | POA: Diagnosis present

## 2018-12-07 DIAGNOSIS — I129 Hypertensive chronic kidney disease with stage 1 through stage 4 chronic kidney disease, or unspecified chronic kidney disease: Secondary | ICD-10-CM | POA: Diagnosis not present

## 2018-12-07 DIAGNOSIS — G9340 Encephalopathy, unspecified: Secondary | ICD-10-CM | POA: Diagnosis present

## 2018-12-07 DIAGNOSIS — K219 Gastro-esophageal reflux disease without esophagitis: Secondary | ICD-10-CM | POA: Diagnosis present

## 2018-12-07 DIAGNOSIS — Z66 Do not resuscitate: Secondary | ICD-10-CM | POA: Diagnosis present

## 2018-12-07 DIAGNOSIS — K469 Unspecified abdominal hernia without obstruction or gangrene: Secondary | ICD-10-CM

## 2018-12-07 DIAGNOSIS — Z885 Allergy status to narcotic agent status: Secondary | ICD-10-CM

## 2018-12-07 DIAGNOSIS — Z8673 Personal history of transient ischemic attack (TIA), and cerebral infarction without residual deficits: Secondary | ICD-10-CM | POA: Diagnosis not present

## 2018-12-07 DIAGNOSIS — I120 Hypertensive chronic kidney disease with stage 5 chronic kidney disease or end stage renal disease: Secondary | ICD-10-CM | POA: Diagnosis present

## 2018-12-07 DIAGNOSIS — F0390 Unspecified dementia without behavioral disturbance: Secondary | ICD-10-CM | POA: Diagnosis present

## 2018-12-07 DIAGNOSIS — R799 Abnormal finding of blood chemistry, unspecified: Secondary | ICD-10-CM

## 2018-12-07 DIAGNOSIS — Z79899 Other long term (current) drug therapy: Secondary | ICD-10-CM | POA: Diagnosis not present

## 2018-12-07 DIAGNOSIS — N185 Chronic kidney disease, stage 5: Secondary | ICD-10-CM | POA: Diagnosis present

## 2018-12-07 DIAGNOSIS — R4182 Altered mental status, unspecified: Secondary | ICD-10-CM | POA: Diagnosis not present

## 2018-12-07 DIAGNOSIS — N2581 Secondary hyperparathyroidism of renal origin: Secondary | ICD-10-CM | POA: Diagnosis present

## 2018-12-07 DIAGNOSIS — C649 Malignant neoplasm of unspecified kidney, except renal pelvis: Secondary | ICD-10-CM | POA: Diagnosis present

## 2018-12-07 DIAGNOSIS — N179 Acute kidney failure, unspecified: Secondary | ICD-10-CM | POA: Diagnosis present

## 2018-12-07 DIAGNOSIS — N2889 Other specified disorders of kidney and ureter: Secondary | ICD-10-CM | POA: Diagnosis present

## 2018-12-07 DIAGNOSIS — N390 Urinary tract infection, site not specified: Secondary | ICD-10-CM | POA: Diagnosis not present

## 2018-12-07 DIAGNOSIS — E875 Hyperkalemia: Secondary | ICD-10-CM | POA: Diagnosis not present

## 2018-12-07 DIAGNOSIS — D631 Anemia in chronic kidney disease: Secondary | ICD-10-CM | POA: Diagnosis not present

## 2018-12-07 DIAGNOSIS — Z20828 Contact with and (suspected) exposure to other viral communicable diseases: Secondary | ICD-10-CM | POA: Diagnosis present

## 2018-12-07 DIAGNOSIS — E1122 Type 2 diabetes mellitus with diabetic chronic kidney disease: Secondary | ICD-10-CM | POA: Diagnosis present

## 2018-12-07 DIAGNOSIS — A419 Sepsis, unspecified organism: Principal | ICD-10-CM | POA: Diagnosis present

## 2018-12-07 DIAGNOSIS — Z7902 Long term (current) use of antithrombotics/antiplatelets: Secondary | ICD-10-CM | POA: Diagnosis not present

## 2018-12-07 DIAGNOSIS — B9620 Unspecified Escherichia coli [E. coli] as the cause of diseases classified elsewhere: Secondary | ICD-10-CM | POA: Diagnosis present

## 2018-12-07 DIAGNOSIS — D649 Anemia, unspecified: Secondary | ICD-10-CM

## 2018-12-07 DIAGNOSIS — R739 Hyperglycemia, unspecified: Secondary | ICD-10-CM

## 2018-12-07 DIAGNOSIS — R140 Abdominal distension (gaseous): Secondary | ICD-10-CM | POA: Diagnosis not present

## 2018-12-07 DIAGNOSIS — Z515 Encounter for palliative care: Secondary | ICD-10-CM | POA: Diagnosis present

## 2018-12-07 DIAGNOSIS — N184 Chronic kidney disease, stage 4 (severe): Secondary | ICD-10-CM | POA: Diagnosis not present

## 2018-12-07 DIAGNOSIS — Z7189 Other specified counseling: Secondary | ICD-10-CM | POA: Diagnosis not present

## 2018-12-07 LAB — CBC WITH DIFFERENTIAL/PLATELET
Abs Immature Granulocytes: 0.28 10*3/uL — ABNORMAL HIGH (ref 0.00–0.07)
Basophils Absolute: 0 10*3/uL (ref 0.0–0.1)
Basophils Relative: 0 %
Eosinophils Absolute: 0 10*3/uL (ref 0.0–0.5)
Eosinophils Relative: 0 %
HCT: 16.3 % — ABNORMAL LOW (ref 36.0–46.0)
Hemoglobin: 5.1 g/dL — ABNORMAL LOW (ref 12.0–15.0)
Immature Granulocytes: 1 %
Lymphocytes Relative: 4 %
Lymphs Abs: 0.8 10*3/uL (ref 0.7–4.0)
MCH: 28.7 pg (ref 26.0–34.0)
MCHC: 31.3 g/dL (ref 30.0–36.0)
MCV: 91.6 fL (ref 80.0–100.0)
Monocytes Absolute: 1.9 10*3/uL — ABNORMAL HIGH (ref 0.1–1.0)
Monocytes Relative: 10 %
Neutro Abs: 17.4 10*3/uL — ABNORMAL HIGH (ref 1.7–7.7)
Neutrophils Relative %: 85 %
Platelets: 526 10*3/uL — ABNORMAL HIGH (ref 150–400)
RBC: 1.78 MIL/uL — ABNORMAL LOW (ref 3.87–5.11)
RDW: 15 % (ref 11.5–15.5)
Smear Review: NORMAL
WBC: 20.4 10*3/uL — ABNORMAL HIGH (ref 4.0–10.5)
nRBC: 0.1 % (ref 0.0–0.2)

## 2018-12-07 LAB — GLUCOSE, CAPILLARY
Glucose-Capillary: 325 mg/dL — ABNORMAL HIGH (ref 70–99)
Glucose-Capillary: 343 mg/dL — ABNORMAL HIGH (ref 70–99)
Glucose-Capillary: 280 mg/dL — ABNORMAL HIGH (ref 70–99)
Glucose-Capillary: 284 mg/dL — ABNORMAL HIGH (ref 70–99)

## 2018-12-07 LAB — PREPARE RBC (CROSSMATCH)

## 2018-12-07 LAB — URINALYSIS, COMPLETE (UACMP) WITH MICROSCOPIC
Bilirubin Urine: NEGATIVE
Glucose, UA: >=500 mg/dL — AB
Ketones, ur: NEGATIVE mg/dL
Nitrite: NEGATIVE
Protein, ur: 100 mg/dL — AB
Specific Gravity, Urine: 1.009 (ref 1.005–1.030)
Squamous Epithelial / LPF: NONE SEEN (ref 0–5)
WBC, UA: >50 WBC/hpf — ABNORMAL HIGH (ref 0–5)
pH: 6 (ref 5.0–8.0)

## 2018-12-07 LAB — COMPREHENSIVE METABOLIC PANEL
ALT: 87 U/L — ABNORMAL HIGH (ref 0–44)
AST: 71 U/L — ABNORMAL HIGH (ref 15–41)
Albumin: 2.4 g/dL — ABNORMAL LOW (ref 3.5–5.0)
Alkaline Phosphatase: 120 U/L (ref 38–126)
Anion gap: 14 (ref 5–15)
BUN: 97 mg/dL — ABNORMAL HIGH (ref 8–23)
CO2: 17 mmol/L — ABNORMAL LOW (ref 22–32)
Calcium: 8.4 mg/dL — ABNORMAL LOW (ref 8.9–10.3)
Chloride: 102 mmol/L (ref 98–111)
Creatinine, Ser: 5.45 mg/dL — ABNORMAL HIGH (ref 0.44–1.00)
GFR calc Af Amer: 8 mL/min — ABNORMAL LOW (ref >60)
GFR calc non Af Amer: 7 mL/min — ABNORMAL LOW (ref >60)
Glucose, Bld: 436 mg/dL — ABNORMAL HIGH (ref 70–99)
Potassium: 5.3 mmol/L — ABNORMAL HIGH (ref 3.5–5.1)
Sodium: 133 mmol/L — ABNORMAL LOW (ref 135–145)
Total Bilirubin: 0.9 mg/dL (ref 0.3–1.2)
Total Protein: 6.5 g/dL (ref 6.5–8.1)

## 2018-12-07 LAB — HEMOGLOBIN AND HEMATOCRIT, BLOOD
HCT: 19.3 % — ABNORMAL LOW (ref 36.0–46.0)
HCT: 23 % — ABNORMAL LOW (ref 36.0–46.0)
Hemoglobin: 6.3 g/dL — ABNORMAL LOW (ref 12.0–15.0)
Hemoglobin: 7.6 g/dL — ABNORMAL LOW (ref 12.0–15.0)

## 2018-12-07 LAB — HEMOGLOBIN A1C
Hgb A1c MFr Bld: 6.8 % — ABNORMAL HIGH (ref 4.8–5.6)
Mean Plasma Glucose: 148.46 mg/dL

## 2018-12-07 LAB — TSH: TSH: 0.977 u[IU]/mL (ref 0.350–4.500)

## 2018-12-07 LAB — ABO/RH: ABO/RH(D): O NEG

## 2018-12-07 LAB — SARS CORONAVIRUS 2 (TAT 6-24 HRS): SARS Coronavirus 2: NEGATIVE

## 2018-12-07 MED ORDER — SODIUM CHLORIDE 0.9 % IV SOLN
10.0000 mL/h | Freq: Once | INTRAVENOUS | Status: AC
  Administered 2018-12-07: 10 mL/h via INTRAVENOUS

## 2018-12-07 MED ORDER — FUROSEMIDE 20 MG PO TABS
20.0000 mg | ORAL_TABLET | Freq: Every day | ORAL | Status: DC
  Administered 2018-12-08: 10:00:00 20 mg via ORAL
  Filled 2018-12-07 (×2): qty 1

## 2018-12-07 MED ORDER — SODIUM CHLORIDE 0.9 % IV SOLN
INTRAVENOUS | Status: DC
  Administered 2018-12-08: via INTRAVENOUS

## 2018-12-07 MED ORDER — ONDANSETRON HCL 4 MG PO TABS: 4.0000 mg | ORAL_TABLET | Freq: Four times a day (QID) | ORAL | Status: DC | PRN

## 2018-12-07 MED ORDER — HEPARIN SODIUM (PORCINE) 5000 UNIT/ML IJ SOLN
5000.0000 [IU] | Freq: Three times a day (TID) | INTRAMUSCULAR | Status: DC
  Administered 2018-12-07: 5000 [IU] via SUBCUTANEOUS
  Filled 2018-12-07: qty 1

## 2018-12-07 MED ORDER — CLOPIDOGREL BISULFATE 75 MG PO TABS
75.0000 mg | ORAL_TABLET | Freq: Every day | ORAL | Status: DC
  Administered 2018-12-08: 75 mg via ORAL
  Filled 2018-12-07 (×2): qty 1

## 2018-12-07 MED ORDER — SODIUM CHLORIDE 0.9 % IV SOLN
Freq: Once | INTRAVENOUS | Status: AC
  Administered 2018-12-07: 09:00:00 via INTRAVENOUS

## 2018-12-07 MED ORDER — DOCUSATE SODIUM 100 MG PO CAPS
100.0000 mg | ORAL_CAPSULE | Freq: Two times a day (BID) | ORAL | Status: DC
  Administered 2018-12-08: 10:00:00 100 mg via ORAL
  Filled 2018-12-07 (×2): qty 1

## 2018-12-07 MED ORDER — ACETAMINOPHEN 325 MG PO TABS: 650.0000 mg | ORAL_TABLET | Freq: Four times a day (QID) | ORAL | Status: DC | PRN

## 2018-12-07 MED ORDER — SODIUM CHLORIDE 0.9% IV SOLUTION
Freq: Once | INTRAVENOUS | Status: AC
  Administered 2018-12-07: 17:00:00 via INTRAVENOUS

## 2018-12-07 MED ORDER — ONDANSETRON HCL 4 MG/2ML IJ SOLN: 4.0000 mg | Freq: Four times a day (QID) | INTRAMUSCULAR | Status: DC | PRN

## 2018-12-07 MED ORDER — VITAMIN D (ERGOCALCIFEROL) 1.25 MG (50000 UNIT) PO CAPS: 50000.0000 [IU] | ORAL_CAPSULE | ORAL | Status: DC

## 2018-12-07 MED ORDER — ACETAMINOPHEN 650 MG RE SUPP
650.0000 mg | Freq: Four times a day (QID) | RECTAL | Status: DC | PRN
  Administered 2018-12-07 – 2018-12-09 (×2): 650 mg via RECTAL
  Filled 2018-12-07 (×2): qty 1

## 2018-12-07 MED ORDER — SODIUM BICARBONATE 650 MG PO TABS
650.0000 mg | ORAL_TABLET | Freq: Two times a day (BID) | ORAL | Status: DC
  Administered 2018-12-07 – 2018-12-08 (×2): 650 mg via ORAL
  Filled 2018-12-07 (×4): qty 1

## 2018-12-07 MED ORDER — SODIUM CHLORIDE 0.9 % IV SOLN
1.0000 g | INTRAVENOUS | Status: DC
  Administered 2018-12-08: 10:00:00 1 g via INTRAVENOUS
  Filled 2018-12-07: qty 1

## 2018-12-07 MED ORDER — INSULIN ASPART 100 UNIT/ML ~~LOC~~ SOLN
SUBCUTANEOUS | Status: AC
  Administered 2018-12-07: 10 [IU]
  Filled 2018-12-07: qty 1

## 2018-12-07 MED ORDER — INSULIN ASPART 100 UNIT/ML ~~LOC~~ SOLN
0.0000 [IU] | Freq: Every day | SUBCUTANEOUS | Status: DC
  Administered 2018-12-07: 22:00:00 3 [IU] via SUBCUTANEOUS
  Filled 2018-12-07: qty 1

## 2018-12-07 MED ORDER — CHLORHEXIDINE GLUCONATE CLOTH 2 % EX PADS
6.0000 | MEDICATED_PAD | Freq: Every day | CUTANEOUS | Status: DC
  Administered 2018-12-07 – 2018-12-08 (×2): 6 via TOPICAL

## 2018-12-07 MED ORDER — SODIUM CHLORIDE 0.9 % IV BOLUS
1000.0000 mL | Freq: Once | INTRAVENOUS | Status: AC
  Administered 2018-12-07: 04:00:00 1000 mL via INTRAVENOUS

## 2018-12-07 MED ORDER — SODIUM CHLORIDE 0.9 % IV SOLN
1.0000 g | Freq: Once | INTRAVENOUS | Status: AC
  Administered 2018-12-07: 04:00:00 1 g via INTRAVENOUS
  Filled 2018-12-07: qty 10

## 2018-12-07 MED ORDER — INSULIN ASPART 100 UNIT/ML ~~LOC~~ SOLN
0.0000 [IU] | Freq: Three times a day (TID) | SUBCUTANEOUS | Status: DC
  Administered 2018-12-07: 7 [IU] via SUBCUTANEOUS
  Administered 2018-12-08: 13:00:00 3 [IU] via SUBCUTANEOUS
  Administered 2018-12-08: 08:00:00 5 [IU] via SUBCUTANEOUS
  Filled 2018-12-07 (×3): qty 1

## 2018-12-07 MED ORDER — VITAMIN B-12 1000 MCG PO TABS
1000.0000 ug | ORAL_TABLET | ORAL | Status: DC
  Administered 2018-12-07: 12:00:00 1000 ug via ORAL
  Filled 2018-12-07: qty 1

## 2018-12-07 MED ORDER — SODIUM CHLORIDE 0.9% IV SOLUTION
Freq: Once | INTRAVENOUS | Status: AC
  Administered 2018-12-07: 09:00:00 via INTRAVENOUS

## 2018-12-07 MED ORDER — INSULIN REGULAR HUMAN 100 UNIT/ML IJ SOLN
10.0000 [IU] | INTRAMUSCULAR | Status: DC
  Filled 2018-12-07: qty 10

## 2018-12-07 MED ORDER — SODIUM CHLORIDE 0.9 % IV SOLN: INTRAVENOUS | Status: DC

## 2018-12-07 NOTE — ED Triage Notes (Signed)
Patient brought in from home by [place]. Per ems family reported that they noticed the patient had increase in weakness about 22:00. Family also states to ems that she was more lethargic. Patient with a history of dementia. Patient oriented to person only.

## 2018-12-07 NOTE — Progress Notes (Signed)
[place] at [facility]                                                                                                                                                                                  Patient Demographics   Michele Abbott, is a 78 y.o. female, DOB - 1940-06-08, [id number]  Admit date - 12/07/2018   Admitting Physician Harrie Foreman, MD  Outpatient Primary MD for the patient is Cletis Athens, MD   LOS - 0  Subjective: Patient currently is sleepy son at bedside    Review of Systems:   CONSTITUTIONAL: Patient sleepy and drowsy  Vitals:   Vitals:   12/07/18 0958 12/07/18 1009 12/07/18 1030 12/07/18 1201  BP: (!) 134/58 134/65 138/63 (!) 144/68  Pulse: 81 84 82 87  Resp: 20 20 20 20   Temp: 98.5 F (36.9 C) 98.4 F (36.9 C) 98.6 F (37 C) 98.5 F (36.9 C)  TempSrc: Oral Oral Oral Oral  SpO2: 98%  100% 100%  Weight:      Height:        Wt Readings from Last 3 Encounters:  12/07/18 70.9 kg  11/23/12 98 kg  11/11/12 95.7 kg     Intake/Output Summary (Last 24 hours) at 12/07/2018 1246 Last data filed at 12/07/2018 1201 Gross per 24 hour  Intake 580 ml  Output -  Net 580 ml    Physical Exam:   GENERAL: Pleasant-appearing in no apparent distress.  HEAD, EYES, EARS, NOSE AND THROAT: Atraumatic, normocephalic. Extraocular muscles are intact. Pupils equal and reactive to light. Sclerae anicteric. No conjunctival injection. No oro-pharyngeal erythema.  NECK: Supple. There is no jugular venous distention. No bruits, no lymphadenopathy, no thyromegaly.  HEART: Regular rate and rhythm,. No murmurs, no rubs, no clicks.  LUNGS: Clear to auscultation bilaterally. No rales or rhonchi. No wheezes.  ABDOMEN: Soft, flat, nontender, nondistended. Has good bowel sounds. No hepatosplenomegaly appreciated.  EXTREMITIES: No evidence of any cyanosis, clubbing, or peripheral edema.  +2 pedal and radial pulses bilaterally.  NEUROLOGIC:  Sleepy SKIN: Moist and warm with no rashes appreciated.  Psych: Not anxious, depressed LN: No inguinal LN enlargement    Antibiotics   Anti-infectives (From admission, onward)   Start     Dose/Rate Route Frequency Ordered Stop   12/08/18 1000  cefTRIAXone (ROCEPHIN) 1 g in sodium chloride 0.9 % 100 mL IVPB     1 g 200 mL/hr over 30 Minutes Intravenous Every 24 hours 12/07/18 0824     12/07/18 0400  cefTRIAXone (ROCEPHIN) 1 g in sodium chloride 0.9 % 100 mL IVPB     1 g 200 mL/hr over 30 Minutes  Intravenous  Once 12/07/18 0354 12/07/18 0442      Medications   Scheduled Meds: . clopidogrel  75 mg Oral Daily  . docusate sodium  100 mg Oral BID  . furosemide  20 mg Oral Daily  . heparin  5,000 Units Subcutaneous Q8H  . insulin aspart  0-5 Units Subcutaneous QHS  . insulin aspart  0-9 Units Subcutaneous TID WC  . insulin regular  10 Units Intravenous STAT  . sodium bicarbonate  650 mg Oral BID  . vitamin B-12  1,000 mcg Oral Weekly  . [START ON 12/11/2018] Vitamin D (Ergocalciferol)  50,000 Units Oral Weekly   Continuous Infusions: . sodium chloride    . sodium chloride    . sodium chloride    . [START ON 12/08/2018] cefTRIAXone (ROCEPHIN)  IV     PRN Meds:.acetaminophen **OR** acetaminophen, ondansetron **OR** ondansetron (ZOFRAN) IV   Data Review:   Micro Results No results found for this or any previous visit (from the past 240 hour(s)).  Radiology Reports Dg Chest 1 View  Result Date: 12/07/2018 CLINICAL DATA:  Altered mental status EXAM: CHEST  1 VIEW COMPARISON:  05/26/2013 FINDINGS: Chronic elevation of the left diaphragm with over lying presumed atelectasis. Normal heart size for technique. Low volume but clear right lung. No effusion or pneumothorax. IMPRESSION: Chronic elevation of the left diaphragm. No acute finding when compared to prior. Electronically Signed   By: Monte Fantasia M.D.   On: 12/07/2018 04:26   Dg Abd 1 View  Result Date:  12/07/2018 CLINICAL DATA:  Altered mental status EXAM: ABDOMEN - 1 VIEW COMPARISON:  Abdominal CT 11/17/2012 FINDINGS: Formed stool seen throughout most colonic segments. Apparent bulging of soft tissue density towards the air column in the right colon with a mottled appearance suggesting stool. Coarse calcification over the right upper quadrant which is dystrophic upper pole renal calcification by comparison CT. Superimposed cholecystectomy clips. There also dystrophic calcifications in the left upper pole kidney. Retrocardiac opacification that is dense. There is pending chest x-ray. IMPRESSION: Moderate distension of the colon by gas and stool. No evident obstruction. Electronically Signed   By: Monte Fantasia M.D.   On: 12/07/2018 04:25   Ct Head Wo Contrast  Result Date: 12/07/2018 CLINICAL DATA:  Encephalopathy EXAM: CT HEAD WITHOUT CONTRAST TECHNIQUE: Contiguous axial images were obtained from the base of the skull through the vertex without intravenous contrast. COMPARISON:  05/26/2013 FINDINGS: Brain: No evidence of acute infarction, hemorrhage, hydrocephalus, extra-axial collection or mass lesion/mass effect. Chronic small vessel ischemia that is confluent in the deep cerebral white matter. Remote lacunar infarct in the left thalamus. Prominent cortical atrophy that is significantly progressed from 2015. Vascular: Atherosclerotic calcification Skull: No acute or aggressive finding Sinuses/Orbits: No acute finding IMPRESSION: 1. No acute finding. 2. Brain atrophy with significant progression from 2015. 3. Extensive chronic small vessel ischemia in the cerebral white matter Electronically Signed   By: Monte Fantasia M.D.   On: 12/07/2018 04:07     CBC Recent Labs  Lab 12/07/18 0320  WBC 20.4*  HGB 5.1*  HCT 16.3*  PLT 526*  MCV 91.6  MCH 28.7  MCHC 31.3  RDW 15.0  LYMPHSABS 0.8  MONOABS 1.9*  EOSABS 0.0  BASOSABS 0.0    Chemistries  Recent Labs  Lab 12/07/18 0320  NA 133*  K  5.3*  CL 102  CO2 17*  GLUCOSE 436*  BUN 97*  CREATININE 5.45*  CALCIUM 8.4*  AST 71*  ALT 87*  ALKPHOS 120  BILITOT 0.9   ------------------------------------------------------------------------------------------------------------------ estimated creatinine clearance is 9.8 mL/min (A) (by C-G formula based on SCr of 5.45 mg/dL (H)). ------------------------------------------------------------------------------------------------------------------ Recent Labs    12/07/18 0840  HGBA1C 6.8*   ------------------------------------------------------------------------------------------------------------------ No results for input(s): CHOL, HDL, LDLCALC, TRIG, CHOLHDL, LDLDIRECT in the last 72 hours. ------------------------------------------------------------------------------------------------------------------ Recent Labs    12/07/18 0840  TSH 0.977   ------------------------------------------------------------------------------------------------------------------ No results for input(s): VITAMINB12, FOLATE, FERRITIN, TIBC, IRON, RETICCTPCT in the last 72 hours.  Coagulation profile No results for input(s): INR, PROTIME in the last 168 hours.  No results for input(s): DDIMER in the last 72 hours.  Cardiac Enzymes No results for input(s): CKMB, TROPONINI, MYOGLOBIN in the last 168 hours.  Invalid input(s): CK ------------------------------------------------------------------------------------------------------------------ Invalid input(s): [place]   This is a 78 year old female admitted for sepsis.  1.  Sepsis: The patient meets criteria via tachypnea and leukocytosis.   Likely urine 2.  Urinary tract infection: Continue ceftriaxone  3.  Acute kidney injury: Superimposed upon chronic kidney disease.    Continue IV fluids 4.  Anemia: Secondary to the above.    Patient being transfused  5.  Transaminitis: Repeat BMP in the morning 6.  Diabetes  mellitus type 2: With hyperglycemia; secondary to infection.    I will start patient on low-dose Levemir 7.  CAD: Stable; continue Plavix 8.  Hypertension: Continue ARB. 9.    Renal cell mass felt to be due to renal cancer prognosis poor 10.    Dementia      Code Status Orders  (From admission, onward)         Start     Ordered   12/07/18 0825  Do not attempt resuscitation (DNR)  Continuous    Question Answer Comment  In the event of cardiac or respiratory ARREST Do not call a "code blue"   In the event of cardiac or respiratory ARREST Do not perform Intubation, CPR, defibrillation or ACLS   In the event of cardiac or respiratory ARREST Use medication by any route, position, wound care, and other measures to relive pain and suffering. May use oxygen, suction and manual treatment of airway obstruction as needed for comfort.      12/07/18 0824        Code Status History    This patient has a current code status but no historical code status.   Advance Care Planning Activity           Consults nephrology  DVT Prophylaxis  Lovenox  Lab Results  Component Value Date   PLT 526 (H) 12/07/2018     Time Spent in minutes 35 minutes greater than 50% of time spent in care coordination and counseling patient regarding the condition and plan of care.   Dustin Flock M.D on 12/07/2018 at 12:46 PM  Between 7am to 6pm - Pager - [phone]  After 6pm go to [email or web address] - Proofreader  [employer]   Office  [phone]

## 2018-12-07 NOTE — ED Notes (Signed)
ED TO INPATIENT HANDOFF REPORT  ED Nurse Name and Phone #: Selinda Eon [phone]  S Name/Age/Gender Michele Abbott 78 y.o. female Room/Bed: ED17A/ED17A  Code Status   Code Status: Not on file  Home/SNF/Other Home Patient oriented to: self Is this baseline? Yes   Triage Complete: Triage complete  [facility] - Decreased LOC  Triage Note Patient brought in from home by [place]. Per ems family reported that they noticed the patient had increase in weakness about 22:00. Family also states to ems that she was more lethargic. Patient with a history of dementia. Patient oriented to person only.   Allergies Allergies  Allergen Reactions  . Codeine Other (See Comments)    shakey    Level of Care/Admitting Diagnosis ED Disposition    ED Disposition Condition [facility]: [place] []  Level of Care: Med-Surg [16]  Covid Evaluation: Asymptomatic Screening Protocol (No Symptoms)  Diagnosis: AKI (acute kidney injury) [facility]) [[id number]]  Admitting Physician: Harrie Foreman [[id number]]  Attending Physician: Harrie Foreman [[id number]]  Estimated length of stay: past midnight tomorrow  Certification:: I certify this patient will need inpatient services for at least 2 midnights  PT Class (Do Not Modify): Inpatient [101]  PT Acc Code (Do Not Modify): Private [1]       B Medical/Surgery History Past Medical History:  Diagnosis Date  . Abdominal pain   . Anemia   . Cancer [facility]) 2008-2009?   kidney  . Dementia ([place])   . Diabetes mellitus without complication ([place])   . GERD (gastroesophageal reflux disease)   . Hemorrhoid 2014  . Stroke [facility])    Past Surgical History:  Procedure Laterality Date  . CHOLECYSTECTOMY    . cyrotherapy     Kidney  . TOTAL ABDOMINAL HYSTERECTOMY       A IV Location/Drains/Wounds Patient Lines/Drains/Airways Status   Active Line/Drains/Airways    Name:   Placement date:    Placement time:   Site:   Days:   Peripheral IV 12/07/18 Left Antecubital   12/07/18    0306    Antecubital   less than 1   Urethral Catheter april, rn Straight-tip 16 Fr.   12/07/18    0406    Straight-tip   less than 1          Intake/Output Last 24 hours No intake or output data in the 24 hours ending 12/07/18 0600  Labs/Imaging Results for orders placed or performed during the hospital encounter of 12/07/18 (from the past 48 hour(s))  CBC with Differential/Platelet     Status: Abnormal   Collection Time: 12/07/18  3:20 AM  Result Value Ref Range   WBC 20.4 (H) 4.0 - 10.5 K/uL   RBC 1.78 (L) 3.87 - 5.11 MIL/uL   Hemoglobin 5.1 (L) 12.0 - 15.0 g/dL   HCT 16.3 (L) 36.0 - 46.0 %   MCV 91.6 80.0 - 100.0 fL   MCH 28.7 26.0 - 34.0 pg   MCHC 31.3 30.0 - 36.0 g/dL   RDW 15.0 11.5 - 15.5 %   Platelets 526 (H) 150 - 400 K/uL   nRBC 0.1 0.0 - 0.2 %   Neutrophils Relative % 85 %   Neutro Abs 17.4 (H) 1.7 - 7.7 K/uL   Lymphocytes Relative 4 %   Lymphs Abs 0.8 0.7 - 4.0 K/uL   Monocytes Relative 10 %   Monocytes Absolute 1.9 (H) 0.1 - 1.0 K/uL  Eosinophils Relative 0 %   Eosinophils Absolute 0.0 0.0 - 0.5 K/uL   Basophils Relative 0 %   Basophils Absolute 0.0 0.0 - 0.1 K/uL   WBC Morphology MORPHOLOGY UNREMARKABLE    Smear Review Normal platelet morphology    Immature Granulocytes 1 %   Abs Immature Granulocytes 0.28 (H) 0.00 - 0.07 K/uL   Target Cells PRESENT     Comment: Performed at [facility], [address] [place], [place] [address]  Comprehensive metabolic panel     Status: Abnormal   Collection Time: 12/07/18  3:20 AM  Result Value Ref Range   Sodium 133 (L) 135 - 145 mmol/L   Potassium 5.3 (H) 3.5 - 5.1 mmol/L   Chloride 102 98 - 111 mmol/L   CO2 17 (L) 22 - 32 mmol/L   Glucose, Bld 436 (H) 70 - 99 mg/dL   BUN 97 (H) 8 - 23 mg/dL   Creatinine, Ser 5.45 (H) 0.44 - 1.00 mg/dL   Calcium 8.4 (L) 8.9 - 10.3 mg/dL   Total Protein 6.5 6.5 - 8.1 g/dL    Albumin 2.4 (L) 3.5 - 5.0 g/dL   AST 71 (H) 15 - 41 U/L   ALT 87 (H) 0 - 44 U/L   Alkaline Phosphatase 120 38 - 126 U/L   Total Bilirubin 0.9 0.3 - 1.2 mg/dL   GFR calc non Af Amer 7 (L) >60 mL/min   GFR calc Af Amer 8 (L) >60 mL/min   Anion gap 14 5 - 15    Comment: Performed at [facility], [address] [place], [place] [address]  Urinalysis, Complete w Microscopic     Status: Abnormal   Collection Time: 12/07/18  3:20 AM  Result Value Ref Range   Color, Urine AMBER (A) YELLOW    Comment: BIOCHEMICALS MAY BE AFFECTED BY COLOR   APPearance TURBID (A) CLEAR   Specific Gravity, Urine 1.009 1.005 - 1.030   pH 6.0 5.0 - 8.0   Glucose, UA >=500 (A) NEGATIVE mg/dL   Hgb urine dipstick SMALL (A) NEGATIVE   Bilirubin Urine NEGATIVE NEGATIVE   Ketones, ur NEGATIVE NEGATIVE mg/dL   Protein, ur 100 (A) NEGATIVE mg/dL   Nitrite NEGATIVE NEGATIVE   Leukocytes,Ua MODERATE (A) NEGATIVE   RBC / HPF 0-5 0 - 5 RBC/hpf   WBC, UA >50 (H) 0 - 5 WBC/hpf   Bacteria, UA MANY (A) NONE SEEN   Squamous Epithelial / LPF NONE SEEN 0 - 5   Mucus PRESENT    Amorphous Crystal PRESENT     Comment: Performed at [facility], [address] [place], [place] [address]  ABO/Rh     Status: None   Collection Time: 12/07/18  3:20 AM  Result Value Ref Range   ABO/RH(D)      Jenetta Downer NEG Performed at [facility], [address] [place], [place] [address]   Type and screen     Status: None (Preliminary result)   Collection Time: 12/07/18  4:08 AM  Result Value Ref Range   ABO/RH(D) PENDING    Antibody Screen PENDING    Sample Expiration      12/10/2018,2359 Performed at [facility], [address] [place], [place] [address]   Type and screen Ordered by PROVIDER DEFAULT     Status: None   Collection Time: 12/07/18  4:44 AM  Result Value Ref Range   ABO/RH(D) O NEG    Antibody Screen NEG    Sample  Expiration      12/10/2018,2359 Performed at [facility],  [address] [place], [place] [address]    Dg Chest 1 View  Result Date: 12/07/2018 CLINICAL DATA:  Altered mental status EXAM: CHEST  1 VIEW COMPARISON:  05/26/2013 FINDINGS: Chronic elevation of the left diaphragm with over lying presumed atelectasis. Normal heart size for technique. Low volume but clear right lung. No effusion or pneumothorax. IMPRESSION: Chronic elevation of the left diaphragm. No acute finding when compared to prior. Electronically Signed   By: Monte Fantasia M.D.   On: 12/07/2018 04:26   Dg Abd 1 View  Result Date: 12/07/2018 CLINICAL DATA:  Altered mental status EXAM: ABDOMEN - 1 VIEW COMPARISON:  Abdominal CT 11/17/2012 FINDINGS: Formed stool seen throughout most colonic segments. Apparent bulging of soft tissue density towards the air column in the right colon with a mottled appearance suggesting stool. Coarse calcification over the right upper quadrant which is dystrophic upper pole renal calcification by comparison CT. Superimposed cholecystectomy clips. There also dystrophic calcifications in the left upper pole kidney. Retrocardiac opacification that is dense. There is pending chest x-ray. IMPRESSION: Moderate distension of the colon by gas and stool. No evident obstruction. Electronically Signed   By: Monte Fantasia M.D.   On: 12/07/2018 04:25   Ct Head Wo Contrast  Result Date: 12/07/2018 CLINICAL DATA:  Encephalopathy EXAM: CT HEAD WITHOUT CONTRAST TECHNIQUE: Contiguous axial images were obtained from the base of the skull through the vertex without intravenous contrast. COMPARISON:  05/26/2013 FINDINGS: Brain: No evidence of acute infarction, hemorrhage, hydrocephalus, extra-axial collection or mass lesion/mass effect. Chronic small vessel ischemia that is confluent in the deep cerebral white matter. Remote lacunar infarct in the left thalamus. Prominent cortical atrophy that is significantly progressed from 2015. Vascular: Atherosclerotic calcification Skull: No  acute or aggressive finding Sinuses/Orbits: No acute finding IMPRESSION: 1. No acute finding. 2. Brain atrophy with significant progression from 2015. 3. Extensive chronic small vessel ischemia in the cerebral white matter Electronically Signed   By: Monte Fantasia M.D.   On: 12/07/2018 04:07    Pending Labs Unresulted Labs (From admission, onward)    Start     Ordered   12/07/18 0444  SARS CORONAVIRUS 2 (TAT 6-24 HRS) Nasopharyngeal Nasopharyngeal Swab  (Asymptomatic/Tier 2 Patients Labs)  Once,   STAT    Question Answer Comment  Is this test for diagnosis or screening Screening   Symptomatic for COVID-19 as defined by CDC No   Hospitalized for COVID-19 No   Admitted to ICU for COVID-19 No   Previously tested for COVID-19 No   Resident in a congregate (group) care setting No   Employed in healthcare setting No   Pregnant No      12/07/18 0443   12/07/18 0443  Prepare RBC  (Adult Blood Administration - PRBC)  ONCE - STAT,   STAT    Question Answer Comment  # of Units 3 units   Transfusion Indications Symptomatic Anemia   If emergent release call blood bank [place] [phone]      12/07/18 0442   12/07/18 0355  Urine Culture  Add-on,   AD     12/07/18 0354   Signed and Held  Hemoglobin A1c  Once,   R    Comments: To assess prior glycemic control    Signed and Held   Signed and Held  TSH  Add-on,   R     Signed and Held   Signed and Held  Prepare RBC  (Adult Blood Administration - Red Blood Cells)  Once,   R    Question Answer Comment  # of Units 2 units   Transfusion Indications Symptomatic Anemia   If emergent release call blood bank Not emergent release   Instructions: Transfuse      Signed and Held          Vitals/Pain Today's Vitals   12/07/18 0308 12/07/18 0315 12/07/18 0330 12/07/18 0557  BP: 140/64 (!) 135/59 (!) 125/59 134/62  Pulse: 83 83 78 78  Resp: (!) 22 (!) 23 (!) 33 (!) 32  Temp: 99 F (37.2 C)     TempSrc: Oral     SpO2: 94% 98% 100%  100%  Weight:      Height:        Isolation Precautions No active isolations  Medications Medications  0.9 %  sodium chloride infusion (has no administration in time range)  0.9 %  sodium chloride infusion (has no administration in time range)  sodium chloride 0.9 % bolus 1,000 mL (1,000 mLs Intravenous New Bag/Given 12/07/18 0412)  cefTRIAXone (ROCEPHIN) 1 g in sodium chloride 0.9 % 100 mL IVPB (1 g Intravenous New Bag/Given 12/07/18 0412)    Mobility walks with person assist High fall risk   Focused Assessments Neuro Assessment Handoff:  Swallow screen pass? na         Neuro Assessment:   Neuro Checks:      Last Documented NIHSS Modified Score:   Has TPA been given? No If patient is a Neuro Trauma and patient is going to OR before floor call report to [place] nurse: [phone] or [phone]     R Recommendations: See Admitting Provider Note  Report given to:   Additional Notes:

## 2018-12-07 NOTE — Progress Notes (Signed)
Advanced care plan.  Purpose of the Encounter: CODE STATUS  Parties in Attendance: Patient herself and son at bedside  Patient's Decision Capacity: Not intact Subjective/Patient's story: Patient is 78 year old with history of dementia noted to have a renal mass which is thought to be renal cell cancer per outpatient physician.  Who is presenting with altered mental status noticed to have a very low hemoglobin.   Objective/Medical story I discussed with the son regarding overall poor prognosis.  Confirmed DNR status.  Also recommended palliative care input.   Goals of care determination:  Continue DNR status palliative care input prognosis poor   CODE STATUS: DNR   Time spent discussing advanced care planning: 16 minutes

## 2018-12-07 NOTE — ED Notes (Signed)
Spoke with Dr. Marcille Blanco regarding patient's admission. Per Dr. Marcille Blanco patient to give patient insulin and then she can be transferred to the floor.

## 2018-12-07 NOTE — Progress Notes (Signed)
Expressed concern to unit charge nurse of patient safety being admitted to the unit. Charge nurse in agreement. Received call from Dr. Marcille Blanco, expressed concern of this patient coming to this unit based on patient's declining status. Dr. Marcille Blanco stated he would keep the patient in the ED to further stabilize. Patient is a DNR. This nurse stated I would not accept the patient until further stabilization. Discussed my concern for the patient's safety with Lenda Kelp [facility]. Patient was brought to room 112 without accepting to the unit by Gari Crown RN.

## 2018-12-07 NOTE — H&P (Addendum)
Michele Abbott is an 78 y.o. female.   Chief Complaint: Weakness HPI: The patient with past medical history of diabetes, stroke, renal mass and dementia presents to the emergency department due to weakness and decreased responsiveness.  The patient's son is her primary caretaker and has noticed both presenting symptoms over the last 2 days.  Notably the patient is still eaten albeit with slightly decreased appetite.  She has not had nausea, vomiting or diarrhea.  The patient also has not had fever or contact with COVID positive individuals.  In the emergency department urinalysis was consistent with infection.  The patient also met criteria for sepsis which prompted initiation of blood cultures and antibiotics.  Laboratory evaluation was also significant for hyperglycemia and worsening kidney failure which prompted the emergency department staff to call hospitalist service for admission.  Past Medical History:  Diagnosis Date  . Abdominal pain   . Anemia   . Cancer [facility]) 2008-2009?   kidney  . Dementia ([place])   . Diabetes mellitus without complication ([place])   . GERD (gastroesophageal reflux disease)   . Hemorrhoid 2014  . Stroke [facility])     Past Surgical History:  Procedure Laterality Date  . CHOLECYSTECTOMY    . cyrotherapy     Kidney  . TOTAL ABDOMINAL HYSTERECTOMY      Family History  Problem Relation Age of Onset  . Cancer Sister        ovarian   Social History:  reports that she has never smoked. She has never used smokeless tobacco. She reports that she does not drink alcohol or use drugs.  Allergies:  Allergies  Allergen Reactions  . Codeine Other (See Comments)    shakey    Medications Prior to Admission  Medication Sig Dispense Refill  . clopidogrel (PLAVIX) 75 MG tablet Take 75 mg by mouth daily.    . furosemide (LASIX) 20 MG tablet Take 20 mg by mouth daily.    Marland Kitchen olmesartan (BENICAR) 20 MG tablet Take 20 mg by mouth daily.     . sodium bicarbonate 650 MG tablet  Take 650 mg by mouth 2 (two) times daily.    . vitamin B-12 (CYANOCOBALAMIN) 1000 MCG tablet Take 1,000 mcg by mouth once a week.    . Vitamin D, Ergocalciferol, (DRISDOL) 1.25 MG (50000 UT) CAPS capsule Take 50,000 Units by mouth once a week.      Results for orders placed or performed during the hospital encounter of 12/07/18 (from the past 48 hour(s))  CBC with Differential/Platelet     Status: Abnormal   Collection Time: 12/07/18  3:20 AM  Result Value Ref Range   WBC 20.4 (H) 4.0 - 10.5 K/uL   RBC 1.78 (L) 3.87 - 5.11 MIL/uL   Hemoglobin 5.1 (L) 12.0 - 15.0 g/dL   HCT 16.3 (L) 36.0 - 46.0 %   MCV 91.6 80.0 - 100.0 fL   MCH 28.7 26.0 - 34.0 pg   MCHC 31.3 30.0 - 36.0 g/dL   RDW 15.0 11.5 - 15.5 %   Platelets 526 (H) 150 - 400 K/uL   nRBC 0.1 0.0 - 0.2 %   Neutrophils Relative % 85 %   Neutro Abs 17.4 (H) 1.7 - 7.7 K/uL   Lymphocytes Relative 4 %   Lymphs Abs 0.8 0.7 - 4.0 K/uL   Monocytes Relative 10 %   Monocytes Absolute 1.9 (H) 0.1 - 1.0 K/uL   Eosinophils Relative 0 %   Eosinophils Absolute 0.0 0.0 - 0.5 K/uL  Basophils Relative 0 %   Basophils Absolute 0.0 0.0 - 0.1 K/uL   WBC Morphology MORPHOLOGY UNREMARKABLE    Smear Review Normal platelet morphology    Immature Granulocytes 1 %   Abs Immature Granulocytes 0.28 (H) 0.00 - 0.07 K/uL   Target Cells PRESENT     Comment: Performed at [facility], [address] [place], [place] [address]  Comprehensive metabolic panel     Status: Abnormal   Collection Time: 12/07/18  3:20 AM  Result Value Ref Range   Sodium 133 (L) 135 - 145 mmol/L   Potassium 5.3 (H) 3.5 - 5.1 mmol/L   Chloride 102 98 - 111 mmol/L   CO2 17 (L) 22 - 32 mmol/L   Glucose, Bld 436 (H) 70 - 99 mg/dL   BUN 97 (H) 8 - 23 mg/dL   Creatinine, Ser 5.45 (H) 0.44 - 1.00 mg/dL   Calcium 8.4 (L) 8.9 - 10.3 mg/dL   Total Protein 6.5 6.5 - 8.1 g/dL   Albumin 2.4 (L) 3.5 - 5.0 g/dL   AST 71 (H) 15 - 41 U/L   ALT 87 (H) 0 - 44 U/L   Alkaline  Phosphatase 120 38 - 126 U/L   Total Bilirubin 0.9 0.3 - 1.2 mg/dL   GFR calc non Af Amer 7 (L) >60 mL/min   GFR calc Af Amer 8 (L) >60 mL/min   Anion gap 14 5 - 15    Comment: Performed at [facility], [address] [place], [place] [address]  Urinalysis, Complete w Microscopic     Status: Abnormal   Collection Time: 12/07/18  3:20 AM  Result Value Ref Range   Color, Urine AMBER (A) YELLOW    Comment: BIOCHEMICALS MAY BE AFFECTED BY COLOR   APPearance TURBID (A) CLEAR   Specific Gravity, Urine 1.009 1.005 - 1.030   pH 6.0 5.0 - 8.0   Glucose, UA >=500 (A) NEGATIVE mg/dL   Hgb urine dipstick SMALL (A) NEGATIVE   Bilirubin Urine NEGATIVE NEGATIVE   Ketones, ur NEGATIVE NEGATIVE mg/dL   Protein, ur 100 (A) NEGATIVE mg/dL   Nitrite NEGATIVE NEGATIVE   Leukocytes,Ua MODERATE (A) NEGATIVE   RBC / HPF 0-5 0 - 5 RBC/hpf   WBC, UA >50 (H) 0 - 5 WBC/hpf   Bacteria, UA MANY (A) NONE SEEN   Squamous Epithelial / LPF NONE SEEN 0 - 5   Mucus PRESENT    Amorphous Crystal PRESENT     Comment: Performed at [facility], [address] [place], [place] [address]  Type and screen     Status: None (Preliminary result)   Collection Time: 12/07/18  4:08 AM  Result Value Ref Range   ABO/RH(D) PENDING    Antibody Screen PENDING    Sample Expiration      12/10/2018,2359 Performed at [facility], [address] [place], [place] [address]   Type and screen Ordered by PROVIDER DEFAULT     Status: None (Preliminary result)   Collection Time: 12/07/18  4:44 AM  Result Value Ref Range   ABO/RH(D) PENDING    Antibody Screen PENDING    Sample Expiration      12/10/2018,2359 Performed at [facility], [address] [place], [place] [address]    Dg Chest 1 View  Result Date: 12/07/2018 CLINICAL DATA:  Altered mental status EXAM: CHEST  1 VIEW COMPARISON:  05/26/2013 FINDINGS: Chronic elevation of the left diaphragm with over lying presumed atelectasis.  Normal  heart size for technique. Low volume but clear right lung. No effusion or pneumothorax. IMPRESSION: Chronic elevation of the left diaphragm. No acute finding when compared to prior. Electronically Signed   By: Monte Fantasia M.D.   On: 12/07/2018 04:26   Dg Abd 1 View  Result Date: 12/07/2018 CLINICAL DATA:  Altered mental status EXAM: ABDOMEN - 1 VIEW COMPARISON:  Abdominal CT 11/17/2012 FINDINGS: Formed stool seen throughout most colonic segments. Apparent bulging of soft tissue density towards the air column in the right colon with a mottled appearance suggesting stool. Coarse calcification over the right upper quadrant which is dystrophic upper pole renal calcification by comparison CT. Superimposed cholecystectomy clips. There also dystrophic calcifications in the left upper pole kidney. Retrocardiac opacification that is dense. There is pending chest x-ray. IMPRESSION: Moderate distension of the colon by gas and stool. No evident obstruction. Electronically Signed   By: Monte Fantasia M.D.   On: 12/07/2018 04:25   Ct Head Wo Contrast  Result Date: 12/07/2018 CLINICAL DATA:  Encephalopathy EXAM: CT HEAD WITHOUT CONTRAST TECHNIQUE: Contiguous axial images were obtained from the base of the skull through the vertex without intravenous contrast. COMPARISON:  05/26/2013 FINDINGS: Brain: No evidence of acute infarction, hemorrhage, hydrocephalus, extra-axial collection or mass lesion/mass effect. Chronic small vessel ischemia that is confluent in the deep cerebral white matter. Remote lacunar infarct in the left thalamus. Prominent cortical atrophy that is significantly progressed from 2015. Vascular: Atherosclerotic calcification Skull: No acute or aggressive finding Sinuses/Orbits: No acute finding IMPRESSION: 1. No acute finding. 2. Brain atrophy with significant progression from 2015. 3. Extensive chronic small vessel ischemia in the cerebral white matter Electronically Signed   By: Monte Fantasia M.D.   On: 12/07/2018 04:07    Review of Systems  Unable to perform ROS: Mental status change    Blood pressure (!) 125/59, pulse 78, temperature 99 F (37.2 C), temperature source Oral, resp. rate (!) 33, height _0  (1.676 m), weight 98 kg, SpO2 100 %. Physical Exam  Vitals reviewed. Constitutional: She is oriented to person, place, and time. She appears well-developed and well-nourished. No distress.  HENT:  Head: Normocephalic and atraumatic.  Mouth/Throat: Oropharynx is clear and moist.  Eyes: Pupils are equal, round, and reactive to light. Conjunctivae and EOM are normal. No scleral icterus.  Neck: Normal range of motion. Neck supple. No JVD present. No tracheal deviation present. No thyromegaly present.  Cardiovascular: Normal rate, regular rhythm and normal heart sounds. Exam reveals no gallop and no friction rub.  No murmur heard. Respiratory: Effort normal and breath sounds normal. Tachypnea noted.  GI: Soft. Bowel sounds are normal. She exhibits no distension. There is no abdominal tenderness.  Genitourinary:    Genitourinary Comments: Deferred   Musculoskeletal: Normal range of motion.  Lymphadenopathy:    She has no cervical adenopathy.  Neurological: She is alert and oriented to person, place, and time. No cranial nerve deficit. She exhibits normal muscle tone.  Skin: Skin is warm and dry. No rash noted. No erythema.  Psychiatric:  The patient is rather listless and does not give meaningful verbal responses although her son reports that she is able to speak and it is clear that she knows when we are addressing her and it is also clear that she is sometimes displeased with the discussion, be at prognosis and/or possible interventions.     Assessment/Plan This is a 78 year old female admitted for sepsis. 1.  Sepsis: The patient meets criteria via tachypnea and  leukocytosis.  Source is likely urine.  She is hemodynamically stable.  Continue volume resuscitation and  IV antibiotics.  Follow blood cultures for growth and sensitivities. 2.  Urinary tract infection: Present on admission.  Previous UTI sensitive to ceftriaxone.  Continue ceftriaxone daily 3.  Acute kidney injury: Superimposed upon chronic kidney disease.  The patient reportedly has a renal mass.  Last creatinine in record is 3.53; consult nephrology for further guidance. 4.  Anemia: Secondary to the above.  2 units RBCs ordered for transfusion. 5.  Transaminitis: Unclear etiology.  Associated with elevated creatinine kinase.  Follow-up to rule out rhabdomyolysis which could also be a cause of the patient's acute kidney injury although one would not expect such dramatic increase in serum creatinine with this level of CK. 6.  Diabetes mellitus type 2: With hyperglycemia; secondary to infection.  10 units regular insulin administered IV in the emergency department.  Continue sliding scale insulin while hospitalized. 7.  CAD: Stable; continue Plavix 8.  Hypertension: Continue ARB. 9.  DVT prophylaxis: Heparin 10.  GI prophylaxis: None The patient is a DNR.  Time spent on admission orders and patient care approximately 45 minutes  Harrie Foreman, MD 12/07/2018, 5:31 AM

## 2018-12-07 NOTE — ED Provider Notes (Signed)
[facility] Emergency Department Provider Note  ____________________________________________  Time seen: Approximately 5:57 AM  I have reviewed the triage vital signs and the nursing notes.   HISTORY  Chief Complaint Weakness and Altered Mental Status  Level 5 caveat:  Portions of the history and physical were unable to be obtained due to encephalopathy   HPI Michele Abbott is a 78 y.o. female with a history of diabetes, stroke, anemia, dementia, kidney mass who presents for evaluation of altered mental status.  History is gathered from patient's son was at the bedside.  Patient has had progressively decline in her mental status over the last several days.  Patient has become worse over the last 24 hours.  Has had decreased oral intake.  No cough, no fever, no vomiting, no diarrhea.  Patient is not providing any history at this time.  No trauma.  According to the son patient was told several years ago that she had a kidney mass but opted for no biopsy or any further evaluation.  Son also reports the patient would like to be DNR/DNI although she does not have the appropriate documentation for that. Patient does not wish dialysis per son   Past Medical History:  Diagnosis Date  . Abdominal pain   . Anemia   . Cancer [facility]) 2008-2009?   kidney  . Dementia ([place])   . Diabetes mellitus without complication ([place])   . GERD (gastroesophageal reflux disease)   . Hemorrhoid 2014  . Stroke [facility])     Patient Active Problem List   Diagnosis Date Noted  . AKI (acute kidney injury) ([place]) 12/07/2018  . Pelvic pain in female 11/23/2012  . Ventral hernia 11/11/2012  . Rectal pain 11/11/2012  . Hemorrhoid     Past Surgical History:  Procedure Laterality Date  . CHOLECYSTECTOMY    . cyrotherapy     Kidney  . TOTAL ABDOMINAL HYSTERECTOMY      Prior to Admission medications   Medication Sig Start Date End Date Taking? Authorizing Provider  clopidogrel (PLAVIX) 75  MG tablet Take 75 mg by mouth daily. 10/24/18  Yes [provider]  furosemide (LASIX) 20 MG tablet Take 20 mg by mouth daily.   Yes [provider]  olmesartan (BENICAR) 20 MG tablet Take 20 mg by mouth daily.    Yes [provider]  sodium bicarbonate 650 MG tablet Take 650 mg by mouth 2 (two) times daily. 11/27/18  Yes [provider]  vitamin B-12 (CYANOCOBALAMIN) 1000 MCG tablet Take 1,000 mcg by mouth once a week. 11/27/18  Yes [provider]  Vitamin D, Ergocalciferol, (DRISDOL) 1.25 MG (50000 UT) CAPS capsule Take 50,000 Units by mouth once a week. 11/27/18  Yes [provider]    Allergies Codeine  Family History  Problem Relation Age of Onset  . Cancer Sister        ovarian    Social History Social History   Tobacco Use  . Smoking status: Never Smoker  . Smokeless tobacco: Never Used  Substance Use Topics  . Alcohol use: No  . Drug use: No    Review of Systems  Constitutional: Negative for fever. + AMS Respiratory: Negative for difficulty breathing or cough Gastrointestinal: Negative for vomiting or diarrhea.  ____________________________________________   PHYSICAL EXAM:  VITAL SIGNS: ED Triage Vitals  Enc Vitals Group     BP 12/07/18 0308 140/64     Pulse Rate 12/07/18 0308 83     Resp 12/07/18 0308 (!)  22     Temp 12/07/18 0308 99 F (37.2 C)     Temp Source 12/07/18 0308 Oral     SpO2 12/07/18 0308 94 %     Weight 12/07/18 0306 216 lb 0.8 oz (98 kg)     Height 12/07/18 0306 5\' 6"  (1.676 m)     Head Circumference --      Peak Flow --      Pain Score --      Pain Loc --      Pain Edu? --      Excl. in [place]? --     Constitutional: Somnolent, arousable to sternum rub, follows very simple commands, responds yes mam to all questions, unable to say anything else HEENT:      Head: Normocephalic and atraumatic.         Eyes: Conjunctivae are normal. Sclera is non-icteric.       Mouth/Throat: Mucous  membranes are moist.       Neck: Supple with no signs of meningismus. Cardiovascular: Regular rate and rhythm. No murmurs, gallops, or rubs. 2+ symmetrical distal pulses are present in all extremities. No JVD. Respiratory: Normal respiratory effort. Lungs are clear to auscultation bilaterally. No wheezes, crackles, or rhonchi.  Gastrointestinal: Soft, non tender, large reducible ventral hernia, non distended with positive bowel sounds. No rebound or guarding. Musculoskeletal: No edema, cyanosis, or erythema of extremities. Neurologic: Face is symmetric. Moving all extremities. GCS 11 Skin: Skin is warm, dry and intact. No rash noted.  ____________________________________________   LABS (all labs ordered are listed, but only abnormal results are displayed)  Labs Reviewed  CBC WITH DIFFERENTIAL/PLATELET - Abnormal; Notable for the following components:      Result Value   WBC 20.4 (*)    RBC 1.78 (*)    Hemoglobin 5.1 (*)    HCT 16.3 (*)    Platelets 526 (*)    Neutro Abs 17.4 (*)    Monocytes Absolute 1.9 (*)    Abs Immature Granulocytes 0.28 (*)    All other components within normal limits  COMPREHENSIVE METABOLIC PANEL - Abnormal; Notable for the following components:   Sodium 133 (*)    Potassium 5.3 (*)    CO2 17 (*)    Glucose, Bld 436 (*)    BUN 97 (*)    Creatinine, Ser 5.45 (*)    Calcium 8.4 (*)    Albumin 2.4 (*)    AST 71 (*)    ALT 87 (*)    GFR calc non Af Amer 7 (*)    GFR calc Af Amer 8 (*)    All other components within normal limits  URINALYSIS, COMPLETE (UACMP) WITH MICROSCOPIC - Abnormal; Notable for the following components:   Color, Urine AMBER (*)    APPearance TURBID (*)    Glucose, UA >=500 (*)    Hgb urine dipstick SMALL (*)    Protein, ur 100 (*)    Leukocytes,Ua MODERATE (*)    WBC, UA >50 (*)    Bacteria, UA MANY (*)    All other components within normal limits  URINE CULTURE  SARS CORONAVIRUS 2 (TAT 6-24 HRS)  TYPE AND SCREEN  TYPE AND  SCREEN  PREPARE RBC (CROSSMATCH)  ABO/RH   ____________________________________________  EKG  ED ECG REPORT I, Rudene Re, the attending physician, personally viewed and interpreted this ECG.  Normal sinus rhythm, rate of 80, borderline prolonged QTC, PACs, T wave inversions in inferior leads, no ST elevation. ____________________________________________  RADIOLOGY  I have personally reviewed the images performed during this visit and I agree with the Radiologist's read.   Interpretation by Radiologist:  Dg Chest 1 View  Result Date: 12/07/2018 CLINICAL DATA:  Altered mental status EXAM: CHEST  1 VIEW COMPARISON:  05/26/2013 FINDINGS: Chronic elevation of the left diaphragm with over lying presumed atelectasis. Normal heart size for technique. Low volume but clear right lung. No effusion or pneumothorax. IMPRESSION: Chronic elevation of the left diaphragm. No acute finding when compared to prior. Electronically Signed   By: Monte Fantasia M.D.   On: 12/07/2018 04:26   Dg Abd 1 View  Result Date: 12/07/2018 CLINICAL DATA:  Altered mental status EXAM: ABDOMEN - 1 VIEW COMPARISON:  Abdominal CT 11/17/2012 FINDINGS: Formed stool seen throughout most colonic segments. Apparent bulging of soft tissue density towards the air column in the right colon with a mottled appearance suggesting stool. Coarse calcification over the right upper quadrant which is dystrophic upper pole renal calcification by comparison CT. Superimposed cholecystectomy clips. There also dystrophic calcifications in the left upper pole kidney. Retrocardiac opacification that is dense. There is pending chest x-ray. IMPRESSION: Moderate distension of the colon by gas and stool. No evident obstruction. Electronically Signed   By: Monte Fantasia M.D.   On: 12/07/2018 04:25   Ct Head Wo Contrast  Result Date: 12/07/2018 CLINICAL DATA:  Encephalopathy EXAM: CT HEAD WITHOUT CONTRAST TECHNIQUE: Contiguous axial images were  obtained from the base of the skull through the vertex without intravenous contrast. COMPARISON:  05/26/2013 FINDINGS: Brain: No evidence of acute infarction, hemorrhage, hydrocephalus, extra-axial collection or mass lesion/mass effect. Chronic small vessel ischemia that is confluent in the deep cerebral white matter. Remote lacunar infarct in the left thalamus. Prominent cortical atrophy that is significantly progressed from 2015. Vascular: Atherosclerotic calcification Skull: No acute or aggressive finding Sinuses/Orbits: No acute finding IMPRESSION: 1. No acute finding. 2. Brain atrophy with significant progression from 2015. 3. Extensive chronic small vessel ischemia in the cerebral white matter Electronically Signed   By: Monte Fantasia M.D.   On: 12/07/2018 04:07     ____________________________________________   PROCEDURES  Procedure(s) performed: None Procedures Critical Care performed: yes  CRITICAL CARE Performed by: Rudene Re  ?  Total critical care time: 40 min  Critical care time was exclusive of separately billable procedures and treating other patients.  Critical care was necessary to treat or prevent imminent or life-threatening deterioration.  Critical care was time spent personally by me on the following activities: development of treatment plan with patient and/or surrogate as well as nursing, discussions with consultants, evaluation of patient's response to treatment, examination of patient, obtaining history from patient or surrogate, ordering and performing treatments and interventions, ordering and review of laboratory studies, ordering and review of radiographic studies, pulse oximetry and re-evaluation of patient's condition.  ____________________________________________   INITIAL IMPRESSION / ASSESSMENT AND PLAN / ED COURSE  78 y.o. female with a history of diabetes, stroke, anemia, dementia, kidney mass who presents for evaluation of altered mental  status progressively worse over a week.   Patient with normal vital signs, afebrile, GCS of 11 on arrival, looks extremely dry, abdomen soft with a large reducible ventral hernia, lungs are clear to auscultation.  Differential diagnosis broad and includes infection versus brain bleed versus stroke versus severe dehydration versus sepsis  Patient is work-up consistent with new anemia with a hemoglobin of 5.1.  Rectal exam negative for Hemoccult blood.  Patient will receive a blood  transfusion.  UA positive for UTI with an elevated white count.  Patient was given Rocephin.  Culture is pending.  With no fever, tachycardia, tachypnea patient does not meet sepsis criteria at this time.  Chest x-ray and KUB with no significant findings.  Head CT negative.  Labs with several abnormalities including acute on chronic kidney injury with creatinine of 5.35 (baseline is creatinine of 3).  Elevated BUN of 97 and mild hyperkalemia with no EKG changes.  Patient also hyperglycemic with no evidence of DKA.  Foley catheter was placed for careful I&O monitoring.  Patient was given a bolus of normal saline and placed on maintenance fluids at 150 cc an hour.  Patient's son reports the patient does not wish to be resuscitated or undergo dialysis.  I discussed this with the hospitalist.  Son is in agreement with blood transfusion, antibiotics, IV fluids, and admission to the hospital.  Son understands that patient prognosis is poor.    As part of my medical decision making, I reviewed the following data within the [place] History obtained from family, Nursing notes reviewed and incorporated, Labs reviewed , EKG interpreted , Old EKG reviewed, Old chart reviewed, Radiograph reviewed , Discussed with admitting physician , Notes from prior ED visits and [place] Controlled Substance Database   Patient was evaluated in Emergency Department today for the symptoms described in the history of present illness. Patient  was evaluated in the context of the global COVID-19 pandemic, which necessitated consideration that the patient might be at risk for infection with the SARS-CoV-2 virus that causes COVID-19. Institutional protocols and algorithms that pertain to the evaluation of patients at risk for COVID-19 are in a state of rapid change based on information released by regulatory bodies including the CDC and federal and state organizations. These policies and algorithms were followed during the patient's care in the ED.   ____________________________________________   FINAL CLINICAL IMPRESSION(S) / ED DIAGNOSES   Final diagnoses:  Encephalopathy  Elevated BUN  Acute kidney injury superimposed on chronic kidney disease (HCC)  Anemia, unspecified type  Hyperglycemia  Acute cystitis with hematuria      NEW MEDICATIONS STARTED DURING THIS VISIT:  ED Discharge Orders    None       Note:  This document was prepared using Dragon voice recognition software and may include unintentional dictation errors.    Alfred Levins, [place], MD 12/07/18 [phone]

## 2018-12-07 NOTE — ED Notes (Signed)
Attempted to call Colletta Maryland RN and was placed on hold for 5 minutes.

## 2018-12-07 NOTE — ED Notes (Signed)
Report called to [facility]. Colletta Maryland states that she is not accepting the patient at this time. States that she will contact admitting and then call this RN back.

## 2018-12-07 NOTE — Consult Note (Signed)
[facility] CONSULT NOTE    Date: 12/07/2018                  Patient Name:  Michele Abbott  MRN: [id number]  DOB: August 29, 1940  Age / Sex: 78 y.o., female         PCP: Cletis Athens, MD                 Service Requesting Consult: Hospitalist                 Reason for Consult:  Acute renal failure, chronic kidney disease stage V            History of Present Illness: Patient is a 78 y.o. female with a PMHx of dementia, chronic kidney disease stage V followed by [facility] nephrology, diabetes mellitus type 2, GERD, CVA, anemia of chronic kidney disease, renal mass, who was admitted to [facility] on 12/07/2018 for evaluation of weakness, poor p.o. intake, and worsening debility.  Patient unable to offer any history at this point in time.  History offered by the son who is at the bedside.  The patient's son is apparently a caretaker.  Her p.o. intake has been very poor over the past several days.  She has been followed by [facility] nephrology as an outpatient.  Her baseline EGFR is 12.  It did not appear that family was going to pursue dialysis.  She also has underlying renal mass for which conservative observation was recommended.  Upon our evaluation however she is found to have worsening renal function.  BUN up to 97 with a creatinine of 5.4 and EGFR of 8.  We had a long discussion with the patient's son at the bedside this AM.  It appears that he is not interested in pursuing dialysis for his mother.   Medications: Outpatient medications: Medications Prior to Admission  Medication Sig Dispense Refill Last Dose  . clopidogrel (PLAVIX) 75 MG tablet Take 75 mg by mouth daily.   12/06/2018 at Unknown time  . furosemide (LASIX) 20 MG tablet Take 20 mg by mouth daily.   12/06/2018 at Unknown time  . olmesartan (BENICAR) 20 MG tablet Take 20 mg by mouth daily.    12/06/2018 at Unknown time  . sodium bicarbonate 650 MG tablet Take 650 mg by mouth 2 (two) times daily.   12/06/2018 at Unknown time   . vitamin B-12 (CYANOCOBALAMIN) 1000 MCG tablet Take 1,000 mcg by mouth once a week.   Past Week at Unknown time  . Vitamin D, Ergocalciferol, (DRISDOL) 1.25 MG (50000 UT) CAPS capsule Take 50,000 Units by mouth once a week.   Past Week at Unknown time    Current medications: Current Facility-Administered Medications  Medication Dose Route Frequency Provider Last Rate Last Dose  . 0.9 %  sodium chloride infusion  10 mL/hr Intravenous Once Harrie Foreman, MD      . 0.9 %  sodium chloride infusion   Intravenous Continuous Harrie Foreman, MD      . 0.9 %  sodium chloride infusion   Intravenous Continuous Dustin Flock, MD      . acetaminophen (TYLENOL) tablet 650 mg  650 mg Oral Q6H PRN Harrie Foreman, MD       Or  . acetaminophen (TYLENOL) suppository 650 mg  650 mg Rectal Q6H PRN Harrie Foreman, MD      . Derrill Memo ON 12/08/2018] cefTRIAXone (ROCEPHIN) 1 g in sodium chloride 0.9 % 100 mL IVPB  1 g Intravenous Q24H Harrie Foreman, MD      . clopidogrel (PLAVIX) tablet 75 mg  75 mg Oral Daily Harrie Foreman, MD      . docusate sodium (COLACE) capsule 100 mg  100 mg Oral BID Harrie Foreman, MD      . furosemide (LASIX) tablet 20 mg  20 mg Oral Daily Harrie Foreman, MD      . heparin injection 5,000 Units  5,000 Units Subcutaneous Q8H Harrie Foreman, MD      . insulin aspart (novoLOG) injection 0-5 Units  0-5 Units Subcutaneous QHS Harrie Foreman, MD      . insulin aspart (novoLOG) injection 0-9 Units  0-9 Units Subcutaneous TID WC Harrie Foreman, MD      . insulin regular (NOVOLIN R) 100 units/mL injection 10 Units  10 Units Intravenous STAT Dustin Flock, MD      . ondansetron [facility]) tablet 4 mg  4 mg Oral Q6H PRN Harrie Foreman, MD       Or  . ondansetron [facility]) injection 4 mg  4 mg Intravenous Q6H PRN Harrie Foreman, MD      . sodium bicarbonate tablet 650 mg  650 mg Oral BID Harrie Foreman, MD      . vitamin B-12 (CYANOCOBALAMIN)  tablet 1,000 mcg  1,000 mcg Oral Weekly Harrie Foreman, MD      . Derrill Memo ON 12/11/2018] Vitamin D (Ergocalciferol) (DRISDOL) capsule 50,000 Units  50,000 Units Oral Weekly Harrie Foreman, MD          Allergies: Allergies  Allergen Reactions  . Codeine Other (See Comments)    shakey      Past Medical History: Past Medical History:  Diagnosis Date  . Abdominal pain   . Anemia   . Cancer [facility]) 2008-2009?   kidney  . Dementia ([place])   . Diabetes mellitus without complication ([place])   . GERD (gastroesophageal reflux disease)   . Hemorrhoid 2014  . Stroke [facility])      Past Surgical History: Past Surgical History:  Procedure Laterality Date  . CHOLECYSTECTOMY    . cyrotherapy     Kidney  . TOTAL ABDOMINAL HYSTERECTOMY       Family History: Family History  Problem Relation Age of Onset  . Cancer Sister        ovarian     Social History: Social History   Socioeconomic History  . Marital status: Single    Spouse name: Not on file  . Number of children: Not on file  . Years of education: Not on file  . Highest education level: Not on file  Occupational History  . Not on file  Social Needs  . Financial resource strain: Not on file  . Food insecurity    Worry: Not on file    Inability: Not on file  . Transportation needs    Medical: Not on file    Non-medical: Not on file  Tobacco Use  . Smoking status: Never Smoker  . Smokeless tobacco: Never Used  Substance and Sexual Activity  . Alcohol use: No  . Drug use: No  . Sexual activity: Not on file  Lifestyle  . Physical activity    Days per week: Not on file    Minutes per session: Not on file  . Stress: Not on file  Relationships  . Social Herbalist on phone: Not on file    Gets  together: Not on file    Attends religious service: Not on file    Active member of club or organization: Not on file    Attends meetings of clubs or organizations: Not on file    Relationship status: Not on  file  . Intimate partner violence    Fear of current or ex partner: Not on file    Emotionally abused: Not on file    Physically abused: Not on file    Forced sexual activity: Not on file  Other Topics Concern  . Not on file  Social History Narrative  . Not on file     Review of Systems: Patient unable to provide any history given dementia  Vital Signs: Blood pressure 138/63, pulse 82, temperature 98.6 F (37 C), temperature source Oral, resp. rate 20, height '5\' 11"'$  (1.803 m), weight 70.9 kg, SpO2 100 %.  Weight trends: Filed Weights   12/07/18 0306 12/07/18 0654  Weight: 98 kg 70.9 kg    Physical Exam: General: NAD, lying in bed  Head: Normocephalic, atraumatic.  Eyes: Anicteric, EOMI  Nose: Mucous membranes moist, not inflammed, nonerythematous.  Throat: Oropharynx nonerythematous, no exudate appreciated.   Neck: Supple, trachea midline.  Lungs:  Normal respiratory effort. Clear to auscultation BL without crackles or wheezes.  Heart: RRR. S1 and S2 normal without gallop, murmur, or rubs.  Abdomen:  BS normoactive. Soft, Nondistended, non-tender.  No masses or organomegaly.  Extremities: No pretibial edema.  Neurologic: Lethargic but arousable  Skin: No visible rashes, scars.    Lab results: Basic Metabolic Panel: Recent Labs  Lab 12/07/18 0320  NA 133*  K 5.3*  CL 102  CO2 17*  GLUCOSE 436*  BUN 97*  CREATININE 5.45*  CALCIUM 8.4*    Liver Function Tests: Recent Labs  Lab 12/07/18 0320  AST 71*  ALT 87*  ALKPHOS 120  BILITOT 0.9  PROT 6.5  ALBUMIN 2.4*   No results for input(s): LIPASE, AMYLASE in the last 168 hours. No results for input(s): AMMONIA in the last 168 hours.  CBC: Recent Labs  Lab 12/07/18 0320  WBC 20.4*  NEUTROABS 17.4*  HGB 5.1*  HCT 16.3*  MCV 91.6  PLT 526*    Cardiac Enzymes: No results for input(s): CKTOTAL, CKMB, CKMBINDEX, TROPONINI in the last 168 hours.  BNP: Invalid input(s): POCBNP  CBG: Recent Labs   Lab 12/07/18 0815  GLUCAP 325*    Microbiology: No results found for this or any previous visit.  Coagulation Studies: No results for input(s): LABPROT, INR in the last 72 hours.  Urinalysis: Recent Labs    12/07/18 0320  COLORURINE AMBER*  LABSPEC 1.009  PHURINE 6.0  GLUCOSEU >=500*  HGBUR SMALL*  BILIRUBINUR NEGATIVE  KETONESUR NEGATIVE  PROTEINUR 100*  NITRITE NEGATIVE  LEUKOCYTESUR MODERATE*      Imaging: Dg Chest 1 View  Result Date: 12/07/2018 CLINICAL DATA:  Altered mental status EXAM: CHEST  1 VIEW COMPARISON:  05/26/2013 FINDINGS: Chronic elevation of the left diaphragm with over lying presumed atelectasis. Normal heart size for technique. Low volume but clear right lung. No effusion or pneumothorax. IMPRESSION: Chronic elevation of the left diaphragm. No acute finding when compared to prior. Electronically Signed   By: Monte Fantasia M.D.   On: 12/07/2018 04:26   Dg Abd 1 View  Result Date: 12/07/2018 CLINICAL DATA:  Altered mental status EXAM: ABDOMEN - 1 VIEW COMPARISON:  Abdominal CT 11/17/2012 FINDINGS: Formed stool seen throughout most colonic segments. Apparent bulging of  soft tissue density towards the air column in the right colon with a mottled appearance suggesting stool. Coarse calcification over the right upper quadrant which is dystrophic upper pole renal calcification by comparison CT. Superimposed cholecystectomy clips. There also dystrophic calcifications in the left upper pole kidney. Retrocardiac opacification that is dense. There is pending chest x-ray. IMPRESSION: Moderate distension of the colon by gas and stool. No evident obstruction. Electronically Signed   By: Monte Fantasia M.D.   On: 12/07/2018 04:25   Ct Head Wo Contrast  Result Date: 12/07/2018 CLINICAL DATA:  Encephalopathy EXAM: CT HEAD WITHOUT CONTRAST TECHNIQUE: Contiguous axial images were obtained from the base of the skull through the vertex without intravenous contrast.  COMPARISON:  05/26/2013 FINDINGS: Brain: No evidence of acute infarction, hemorrhage, hydrocephalus, extra-axial collection or mass lesion/mass effect. Chronic small vessel ischemia that is confluent in the deep cerebral white matter. Remote lacunar infarct in the left thalamus. Prominent cortical atrophy that is significantly progressed from 2015. Vascular: Atherosclerotic calcification Skull: No acute or aggressive finding Sinuses/Orbits: No acute finding IMPRESSION: 1. No acute finding. 2. Brain atrophy with significant progression from 2015. 3. Extensive chronic small vessel ischemia in the cerebral white matter Electronically Signed   By: Monte Fantasia M.D.   On: 12/07/2018 04:07      Assessment & Plan: Pt is a 78 y.o. female with a PMHx of dementia, chronic kidney disease stage V followed by [facility] nephrology, diabetes mellitus type 2, GERD, CVA, anemia of chronic kidney disease, renal mass, who was admitted to [facility] on 12/07/2018 for evaluation of weakness, poor p.o. intake, and worsening debility.  1.  Acute renal failure/chronic kidney disease stage V.  Baseline creatinine is 3.5 with an EGFR of 12.  Creatinine now up to 5.4 with an EGFR of 8.  Suspect acute component due to poor p.o. intake.  We had a long discussion with the patient's son who has been her primary caregiver.  He is not interested in pursuing dialysis treatment which is certainly reasonable given her underlying comorbidities.  Therefore we recommend consultation with palliative and hospice care.  2.  Hyperkalemia.  Serum potassium slightly high at 5.3.  No need for continued monitoring.  3.  Anemia of chronic kidney disease.  Hemoglobin very low at 5.1.  She is receiving 1 unit PRBC transfusion.  Further plan as per hospitalist.  4.  Thanks for consultation.

## 2018-12-07 NOTE — Progress Notes (Signed)
Pt arouses when name called/  But drifts back to sleep. Pt on 2nd unit of blood. Foe h/h post 2nd unit.

## 2018-12-07 NOTE — Progress Notes (Signed)
Report called from Kathi Ludwig RN, patient's hemoglobin 5.1, blood glucose 436, creatine 5.5, reported to be lethargic responding to only voice, and potassium at 5.3, respirations 31BPM. Explained this nurse needs to discuss with admitting physician Dr. Marcille Blanco before receiving patient on the unit for patient safety measure. Paged Dr. Marcille Blanco, waiting for a response.

## 2018-12-08 DIAGNOSIS — R4182 Altered mental status, unspecified: Secondary | ICD-10-CM

## 2018-12-08 DIAGNOSIS — Z7189 Other specified counseling: Secondary | ICD-10-CM

## 2018-12-08 DIAGNOSIS — G9340 Encephalopathy, unspecified: Secondary | ICD-10-CM

## 2018-12-08 LAB — BASIC METABOLIC PANEL
Anion gap: 12 (ref 5–15)
BUN: 98 mg/dL — ABNORMAL HIGH (ref 8–23)
CO2: 16 mmol/L — ABNORMAL LOW (ref 22–32)
Calcium: 8.4 mg/dL — ABNORMAL LOW (ref 8.9–10.3)
Chloride: 109 mmol/L (ref 98–111)
Creatinine, Ser: 5.48 mg/dL — ABNORMAL HIGH (ref 0.44–1.00)
GFR calc Af Amer: 8 mL/min — ABNORMAL LOW (ref >60)
GFR calc non Af Amer: 7 mL/min — ABNORMAL LOW (ref >60)
Glucose, Bld: 294 mg/dL — ABNORMAL HIGH (ref 70–99)
Potassium: 5 mmol/L (ref 3.5–5.1)
Sodium: 137 mmol/L (ref 135–145)

## 2018-12-08 LAB — TYPE AND SCREEN
ABO/RH(D): O NEG
Antibody Screen: NEGATIVE
Unit division: 0
Unit division: 0

## 2018-12-08 LAB — CBC WITH DIFFERENTIAL/PLATELET
Abs Immature Granulocytes: 0.32 10*3/uL — ABNORMAL HIGH (ref 0.00–0.07)
Basophils Absolute: 0 10*3/uL (ref 0.0–0.1)
Basophils Relative: 0 %
Eosinophils Absolute: 0 10*3/uL (ref 0.0–0.5)
Eosinophils Relative: 0 %
HCT: 23.8 % — ABNORMAL LOW (ref 36.0–46.0)
Hemoglobin: 7.9 g/dL — ABNORMAL LOW (ref 12.0–15.0)
Immature Granulocytes: 2 %
Lymphocytes Relative: 3 %
Lymphs Abs: 0.7 10*3/uL (ref 0.7–4.0)
MCH: 29.9 pg (ref 26.0–34.0)
MCHC: 33.2 g/dL (ref 30.0–36.0)
MCV: 90.2 fL (ref 80.0–100.0)
Monocytes Absolute: 1.7 10*3/uL — ABNORMAL HIGH (ref 0.1–1.0)
Monocytes Relative: 8 %
Neutro Abs: 18.3 10*3/uL — ABNORMAL HIGH (ref 1.7–7.7)
Neutrophils Relative %: 87 %
Platelets: 358 10*3/uL (ref 150–400)
RBC: 2.64 MIL/uL — ABNORMAL LOW (ref 3.87–5.11)
RDW: 15.2 % (ref 11.5–15.5)
Smear Review: UNDETERMINED
WBC: 21.1 10*3/uL — ABNORMAL HIGH (ref 4.0–10.5)
nRBC: 0.1 % (ref 0.0–0.2)

## 2018-12-08 LAB — GLUCOSE, CAPILLARY
Glucose-Capillary: 268 mg/dL — ABNORMAL HIGH (ref 70–99)
Glucose-Capillary: 232 mg/dL — ABNORMAL HIGH (ref 70–99)
Glucose-Capillary: 209 mg/dL — ABNORMAL HIGH (ref 70–99)

## 2018-12-08 LAB — BPAM RBC
Blood Product Expiration Date: 202010192359
Blood Product Expiration Date: 202010282359
ISSUE DATE / TIME: 202009210948
ISSUE DATE / TIME: 202009211811
Unit Type and Rh: 5100
Unit Type and Rh: 5100

## 2018-12-08 MED ORDER — LORAZEPAM 2 MG/ML IJ SOLN: 1.0000 mg | INTRAMUSCULAR | Status: DC | PRN

## 2018-12-08 MED ORDER — LORAZEPAM 2 MG/ML PO CONC: 1.0000 mg | ORAL | Status: DC | PRN

## 2018-12-08 MED ORDER — HALOPERIDOL LACTATE 5 MG/ML IJ SOLN: 0.5000 mg | INTRAMUSCULAR | Status: DC | PRN

## 2018-12-08 MED ORDER — ONDANSETRON HCL 4 MG/2ML IJ SOLN: 4.0000 mg | Freq: Four times a day (QID) | INTRAMUSCULAR | Status: DC | PRN

## 2018-12-08 MED ORDER — SODIUM CHLORIDE 0.9% FLUSH: 3.0000 mL | INTRAVENOUS | Status: DC | PRN

## 2018-12-08 MED ORDER — ONDANSETRON 4 MG PO TBDP: 4.0000 mg | ORAL_TABLET | Freq: Four times a day (QID) | ORAL | Status: DC | PRN

## 2018-12-08 MED ORDER — POLYVINYL ALCOHOL 1.4 % OP SOLN
1.0000 [drp] | Freq: Four times a day (QID) | OPHTHALMIC | Status: DC | PRN
  Filled 2018-12-08: qty 15

## 2018-12-08 MED ORDER — HALOPERIDOL 0.5 MG PO TABS
0.5000 mg | ORAL_TABLET | ORAL | Status: DC | PRN
  Filled 2018-12-08: qty 1

## 2018-12-08 MED ORDER — GLYCOPYRROLATE 0.2 MG/ML IJ SOLN: 0.2000 mg | INTRAMUSCULAR | Status: DC | PRN

## 2018-12-08 MED ORDER — HALOPERIDOL LACTATE 2 MG/ML PO CONC
0.5000 mg | ORAL | Status: DC | PRN
  Filled 2018-12-08: qty 0.3

## 2018-12-08 MED ORDER — LORAZEPAM 2 MG/ML IJ SOLN: 0.5000 mg | INTRAMUSCULAR | Status: DC | PRN

## 2018-12-08 MED ORDER — BIOTENE DRY MOUTH MT LIQD: 15.0000 mL | OROMUCOSAL | Status: DC | PRN

## 2018-12-08 MED ORDER — LORAZEPAM 1 MG PO TABS: 1.0000 mg | ORAL_TABLET | ORAL | Status: DC | PRN

## 2018-12-08 MED ORDER — GLYCOPYRROLATE 1 MG PO TABS
1.0000 mg | ORAL_TABLET | ORAL | Status: DC | PRN
  Filled 2018-12-08: qty 1

## 2018-12-08 MED ORDER — SODIUM CHLORIDE 0.9% FLUSH
3.0000 mL | Freq: Two times a day (BID) | INTRAVENOUS | Status: DC
  Administered 2018-12-08 – 2018-12-09 (×3): 3 mL via INTRAVENOUS

## 2018-12-08 MED ORDER — FENTANYL CITRATE (PF) 100 MCG/2ML IJ SOLN
25.0000 ug | INTRAMUSCULAR | Status: DC | PRN
  Administered 2018-12-09: 05:00:00 25 ug via INTRAVENOUS
  Filled 2018-12-08: qty 2

## 2018-12-08 NOTE — Consult Note (Signed)
Consultation Note Date: 12/08/2018   Patient Name: Michele Abbott  DOB: [birth year]  MRN: [id number]  Age / Sex: 78 y.o., female  PCP: Cletis Athens, MD Referring Physician: Dustin Flock, MD  Reason for Consultation: Establishing goals of care  HPI/Patient Profile: The patient with past medical history of diabetes, stroke, renal mass and dementia presents to the emergency department due to weakness and decreased responsiveness.  Clinical Assessment and Goals of Care: Patient is resting in bed, does not speak. Her son comes immediately in to bedside on my arrival. He states he has already talked with the doctors and his mother will be made comfort care. [name] is an only child. Ms. Henneke has lived with him. He states a few weeks ago, she was alert, talkative, and sometimes able to feed herself. Her appetite was decent. She could walk with assistance to the bathroom, but has been wearing depends. She requires assistance with hygiene. He states she has declined rapidly.   We discussed her diagnosis, prognosis, GOC, EOL wishes disposition and options.  A detailed discussion was had today regarding advanced directives.  Concepts specific to code status, artifical feeding and hydration, IV antibiotics and rehospitalization were discussed.  The difference between an aggressive medical intervention path and a comfort care path was discussed.  Values and goals of care important to patient and family were attempted to be elicited.  Discussed limitations of medical interventions to prolong quality of life in some situations and discussed the concept of human mortality. Natural trajectory and expectations at EOL were discussed.   Mr. Aaron would like his mother sent to the hospice home for end of life management. He does not want to pursue dialysis and would like to stop curative treatment of her UTI as he does not want  to prolong suffering.   I completed a MOST form today and the signed original was placed in the chart. A photocopy was placed in the chart to be scanned into EMR. The patient outlined their wishes for the following treatment decisions:  Cardiopulmonary Resuscitation: Do Not Attempt Resuscitation (DNR/No CPR)  Medical Interventions: Comfort Measures: Keep clean, warm, and dry. Use medication by any route, positioning, wound care, and other measures to relieve pain and suffering. Use oxygen, suction and manual treatment of airway obstruction as needed for comfort. Do not transfer to the hospital unless comfort needs cannot be met in current location.  Antibiotics: No antibiotics (use other measures to relieve symptoms)  IV Fluids: No IV fluids (provide other measures to ensure comfort)  Feeding Tube: No feeding tube      SUMMARY OF RECOMMENDATIONS    Comfort care.    Symptom Management: PRN medications:   Fentanyl for pain and dyspnea  Robinul for excessive secretions  Ativan for anxiety  Haldol for agitation.    Prognosis:    < 2 weeks Not initiating dialysis. Symptom management for UTI.   Discharge Planning: Hospice facility      Primary Diagnoses: Present on Admission: . AKI (acute kidney injury) ([place])  I have reviewed the medical record, interviewed the patient and family, and examined the patient. The following aspects are pertinent.  Past Medical History:  Diagnosis Date  . Abdominal pain   . Anemia   . Cancer [facility]) 2008-2009?   kidney  . Dementia ([place])   . Diabetes mellitus without complication ([place])   . GERD (gastroesophageal reflux disease)   . Hemorrhoid 2014  . Stroke [facility])    Social History   Socioeconomic History  . Marital status: Single    Spouse name: Not on file  . Number of children: Not on file  . Years of education: Not on file  . Highest education level: Not on file  Occupational History  . Not on file  Social Needs  . Financial  resource strain: Not on file  . Food insecurity    Worry: Not on file    Inability: Not on file  . Transportation needs    Medical: Not on file    Non-medical: Not on file  Tobacco Use  . Smoking status: Never Smoker  . Smokeless tobacco: Never Used  Substance and Sexual Activity  . Alcohol use: No  . Drug use: No  . Sexual activity: Not on file  Lifestyle  . Physical activity    Days per week: Not on file    Minutes per session: Not on file  . Stress: Not on file  Relationships  . Social Herbalist on phone: Not on file    Gets together: Not on file    Attends religious service: Not on file    Active member of club or organization: Not on file    Attends meetings of clubs or organizations: Not on file    Relationship status: Not on file  Other Topics Concern  . Not on file  Social History Narrative  . Not on file   Family History  Problem Relation Age of Onset  . Cancer Sister        ovarian   Scheduled Meds: . Chlorhexidine Gluconate Cloth  6 each Topical Daily  . clopidogrel  75 mg Oral Daily  . docusate sodium  100 mg Oral BID  . furosemide  20 mg Oral Daily  . insulin aspart  0-5 Units Subcutaneous QHS  . insulin aspart  0-9 Units Subcutaneous TID WC  . sodium bicarbonate  650 mg Oral BID  . vitamin B-12  1,000 mcg Oral Weekly  . [START ON 12/11/2018] Vitamin D (Ergocalciferol)  50,000 Units Oral Weekly   Continuous Infusions: . sodium chloride 75 mL/hr at 12/08/18 0000  . cefTRIAXone (ROCEPHIN)  IV 1 g (12/08/18 1022)   PRN Meds:.acetaminophen **OR** acetaminophen, ondansetron **OR** ondansetron (ZOFRAN) IV Medications Prior to Admission:  Prior to Admission medications   Medication Sig Start Date End Date Taking? Authorizing Provider  clopidogrel (PLAVIX) 75 MG tablet Take 75 mg by mouth daily. 10/24/18  Yes [provider]  furosemide (LASIX) 20 MG tablet Take 20 mg by mouth daily.   Yes [provider]  olmesartan (BENICAR)  20 MG tablet Take 20 mg by mouth daily.    Yes [provider]  sodium bicarbonate 650 MG tablet Take 650 mg by mouth 2 (two) times daily. 11/27/18  Yes [provider]  vitamin B-12 (CYANOCOBALAMIN) 1000 MCG tablet Take 1,000 mcg by mouth once a week. 11/27/18  Yes [provider]  Vitamin D, Ergocalciferol, (DRISDOL) 1.25 MG (50000 UT) CAPS capsule Take 50,000 Units by mouth  once a week. 11/27/18  Yes [provider]   Allergies  Allergen Reactions  . Codeine Other (See Comments)    shakey  . Morphine Rash and Other (See Comments)   Review of Systems  Unable to perform ROS   Physical Exam Constitutional:      Comments: Opens eyes intermittently.      Vital Signs: BP (!) 142/65   Pulse 69   Temp 98.6 F (37 C)   Resp 20   Ht _0  (1.803 m)   Wt 70.9 kg   SpO2 100%   BMI 21.80 kg/m  Pain Scale: PAINAD   Pain Score: 0-No pain   SpO2: SpO2: 100 % O2 Device:SpO2: 100 % O2 Flow Rate: .   IO: Intake/output summary:   Intake/Output Summary (Last 24 hours) at 12/08/2018 1314 Last data filed at 12/08/2018 8682 Gross per 24 hour  Intake 1851.22 ml  Output 600 ml  Net 1251.22 ml    LBM: Last BM Date: 12/06/18 Baseline Weight: Weight: 98 kg Most recent weight: Weight: 70.9 kg     Palliative Assessment/Data:     Time In: 12:40 Time Out: 1:15 Time Total: 35 min Greater than 50%  of this time was spent counseling and coordinating care related to the above assessment and plan.  Signed by: Asencion Gowda, NP   Please contact Palliative Medicine Team phone at [phone] for questions and concerns.  For individual provider: See Shea Evans

## 2018-12-08 NOTE — Progress Notes (Signed)
Inpatient Diabetes Program Recommendations  AACE/ADA: New Consensus Statement on Inpatient Glycemic Control (2015)  Target Ranges:  Prepandial:   less than 140 mg/dL      Peak postprandial:   less than 180 mg/dL (1-2 hours)      Critically ill patients:  140 - 180 mg/dL   Lab Results  Component Value Date   GLUCAP 268 (H) 12/08/2018   HGBA1C 6.8 (H) 12/07/2018    Review of Glycemic Control Results for Michele Abbott, Michele Abbott (MRN [id number]) as of 12/08/2018 10:34  Ref. Range 12/07/2018 08:15 12/07/2018 12:38 12/07/2018 19:06 12/07/2018 21:39 12/08/2018 07:19  Glucose-Capillary Latest Ref Range: 70 - 99 mg/dL 325 (H) 343 (H) 280 (H) 284 (H) 268 (H)    Inpatient Diabetes Program Recommendations:   Patient has received 15 units Novolog correction over the past 24 hrs. May consider Lantus 10 units daily for patient comfort.  Thank you, Nani Gasser. Alfonso Carden, RN, MSN, CDE  Diabetes Coordinator Inpatient Glycemic Control Team Team Pager [phone] (8am-5pm) 12/08/2018 10:36 AM

## 2018-12-08 NOTE — Progress Notes (Signed)
New referral for [facility] hospice home received from [facility] Jeanna Creech. Patient information faxed to referral. Writer met with patient's son Lamarc to initiate education regarding hospice home services. Patient seen lying in bed, alert to name, appeared lethargic, eating only a small amount. IV fluids and antibiotics have been stopped. Lamarc and hospital care team aware that there is currently no bed available for transfer. Will continue to follow and update family and hospital care team regarding bed availability.  °Karen Robertson BSN, RN, CHPN °Hospital Liaison °[facility]  °[phone] °

## 2018-12-08 NOTE — Progress Notes (Signed)
[facility]                                                                                                                                                                                  Patient Demographics   Michele Abbott, is a 78 y.o. female, DOB - 08/27/1940, HAL:[id number]  Admit date - 12/07/2018   Admitting Physician Harrie Foreman, MD  Outpatient Primary MD for the patient is Cletis Athens, MD   LOS - 1  Subjective: Patient is more awake    Review of Systems:   CONSTITUTIONAL: Patient more awake  Vitals:   Vitals:   12/08/18 0000 12/08/18 0223 12/08/18 0457 12/08/18 0841  BP:  (!) 141/68 (!) 146/74 (!) 142/65  Pulse:  73 72 69  Resp: (!) 22 20 18 20   Temp:  98.1 F (36.7 C) 98.7 F (37.1 C) 98.6 F (37 C)  TempSrc:  Oral Axillary   SpO2:  100% 100% 100%  Weight:      Height:        Wt Readings from Last 3 Encounters:  12/07/18 70.9 kg  11/23/12 98 kg  11/11/12 95.7 kg     Intake/Output Summary (Last 24 hours) at 12/08/2018 1246 Last data filed at 12/08/2018 0504 Gross per 24 hour  Intake 1851.22 ml  Output 600 ml  Net 1251.22 ml    Physical Exam:   GENERAL: Pleasant-appearing in no apparent distress.  HEAD, EYES, EARS, NOSE AND THROAT: Atraumatic, normocephalic. Extraocular muscles are intact. Pupils equal and reactive to light. Sclerae anicteric. No conjunctival injection. No oro-pharyngeal erythema.  NECK: Supple. There is no jugular venous distention. No bruits, no lymphadenopathy, no thyromegaly.  HEART: Regular rate and rhythm,. No murmurs, no rubs, no clicks.  LUNGS: Clear to auscultation bilaterally. No rales or rhonchi. No wheezes.  ABDOMEN: Soft, flat, nontender, nondistended. Has good bowel sounds. No hepatosplenomegaly appreciated.  EXTREMITIES: No evidence of any cyanosis, clubbing, or peripheral edema.  +2 pedal and radial pulses bilaterally.  NEUROLOGIC: Sleepy SKIN: Moist and warm with no  rashes appreciated.  Psych: Not anxious, depressed LN: No inguinal LN enlargement    Antibiotics   Anti-infectives (From admission, onward)   Start     Dose/Rate Route Frequency Ordered Stop   12/08/18 1000  cefTRIAXone (ROCEPHIN) 1 g in sodium chloride 0.9 % 100 mL IVPB     1 g 200 mL/hr over 30 Minutes Intravenous Every 24 hours 12/07/18 0824     12/07/18 0400  cefTRIAXone (ROCEPHIN) 1 g in sodium chloride 0.9 % 100 mL IVPB     1 g 200 mL/hr over 30 Minutes Intravenous  Once 12/07/18  0354 12/07/18 0442      Medications   Scheduled Meds: . Chlorhexidine Gluconate Cloth  6 each Topical Daily  . clopidogrel  75 mg Oral Daily  . docusate sodium  100 mg Oral BID  . furosemide  20 mg Oral Daily  . insulin aspart  0-5 Units Subcutaneous QHS  . insulin aspart  0-9 Units Subcutaneous TID WC  . sodium bicarbonate  650 mg Oral BID  . vitamin B-12  1,000 mcg Oral Weekly  . [START ON 12/11/2018] Vitamin D (Ergocalciferol)  50,000 Units Oral Weekly   Continuous Infusions: . sodium chloride 75 mL/hr at 12/08/18 0000  . cefTRIAXone (ROCEPHIN)  IV 1 g (12/08/18 1022)   PRN Meds:.acetaminophen **OR** acetaminophen, ondansetron **OR** ondansetron (ZOFRAN) IV   Data Review:   Micro Results Recent Results (from the past 240 hour(s))  Urine Culture     Status: Abnormal (Preliminary result)   Collection Time: 12/07/18  3:10 AM   Specimen: Urine, Catheterized  Result Value Ref Range Status   Specimen Description   Final    URINE, CATHETERIZED Performed at [facility], [address] [place], [place] [address]    Special Requests   Final    NONE Performed at [facility], [address], [place], [place] [address]    Culture (A)  Final    >=100,000 COLONIES/mL ESCHERICHIA COLI SUSCEPTIBILITIES TO FOLLOW Performed at [facility], [place] [address], [place], [place] [address]    Report Status PENDING  Incomplete  SARS CORONAVIRUS 2 (TAT 6-24 HRS)  Nasopharyngeal Nasopharyngeal Swab     Status: None   Collection Time: 12/07/18  5:33 AM   Specimen: Nasopharyngeal Swab  Result Value Ref Range Status   SARS Coronavirus 2 NEGATIVE NEGATIVE Final    Comment: (NOTE) SARS-CoV-2 target nucleic acids are NOT DETECTED. The SARS-CoV-2 RNA is generally detectable in upper and lower respiratory specimens during the acute phase of infection. Negative results do not preclude SARS-CoV-2 infection, do not rule out co-infections with other pathogens, and should not be used as the sole basis for treatment or other patient management decisions. Negative results must be combined with clinical observations, patient history, and epidemiological information. The expected result is Negative. Fact Sheet for Patients: SugarRoll.be Fact Sheet for Healthcare Providers: https://www.woods-mathews.com/ This test is not yet approved or cleared by the [place] FDA and  has been authorized for detection and/or diagnosis of SARS-CoV-2 by FDA under an Emergency Use Authorization (EUA). This EUA will remain  in effect (meaning this test can be used) for the duration of the COVID-19 declaration under Section 56 4(b)(1) of the Act, 21 U.S.C. section 360bbb-3(b)(1), unless the authorization is terminated or revoked sooner. Performed at [facility], [place] [address] [place], [place] [address]     Radiology Reports Dg Chest 1 View  Result Date: 12/07/2018 CLINICAL DATA:  Altered mental status EXAM: CHEST  1 VIEW COMPARISON:  05/26/2013 FINDINGS: Chronic elevation of the left diaphragm with over lying presumed atelectasis. Normal heart size for technique. Low volume but clear right lung. No effusion or pneumothorax. IMPRESSION: Chronic elevation of the left diaphragm. No acute finding when compared to prior. Electronically Signed   By: Monte Fantasia M.D.   On: 12/07/2018 04:26   Dg Abd 1 View  Result Date:  12/07/2018 CLINICAL DATA:  Altered mental status EXAM: ABDOMEN - 1 VIEW COMPARISON:  Abdominal CT 11/17/2012 FINDINGS: Formed stool seen throughout most colonic segments. Apparent bulging of  soft tissue density towards the air column in the right colon with a mottled appearance suggesting stool. Coarse calcification over the right upper quadrant which is dystrophic upper pole renal calcification by comparison CT. Superimposed cholecystectomy clips. There also dystrophic calcifications in the left upper pole kidney. Retrocardiac opacification that is dense. There is pending chest x-ray. IMPRESSION: Moderate distension of the colon by gas and stool. No evident obstruction. Electronically Signed   By: Monte Fantasia M.D.   On: 12/07/2018 04:25   Ct Head Wo Contrast  Result Date: 12/07/2018 CLINICAL DATA:  Encephalopathy EXAM: CT HEAD WITHOUT CONTRAST TECHNIQUE: Contiguous axial images were obtained from the base of the skull through the vertex without intravenous contrast. COMPARISON:  05/26/2013 FINDINGS: Brain: No evidence of acute infarction, hemorrhage, hydrocephalus, extra-axial collection or mass lesion/mass effect. Chronic small vessel ischemia that is confluent in the deep cerebral white matter. Remote lacunar infarct in the left thalamus. Prominent cortical atrophy that is significantly progressed from 2015. Vascular: Atherosclerotic calcification Skull: No acute or aggressive finding Sinuses/Orbits: No acute finding IMPRESSION: 1. No acute finding. 2. Brain atrophy with significant progression from 2015. 3. Extensive chronic small vessel ischemia in the cerebral white matter Electronically Signed   By: Monte Fantasia M.D.   On: 12/07/2018 04:07     CBC Recent Labs  Lab 12/07/18 0320 12/07/18 1349 12/07/18 2216 12/08/18 0826  WBC 20.4*  --   --  21.1*  HGB 5.1* 6.3* 7.6* 7.9*  HCT 16.3* 19.3* 23.0* 23.8*  PLT 526*  --   --  358  MCV 91.6  --   --  90.2  MCH 28.7  --   --  29.9  MCHC 31.3   --   --  33.2  RDW 15.0  --   --  15.2  LYMPHSABS 0.8  --   --  0.7  MONOABS 1.9*  --   --  1.7*  EOSABS 0.0  --   --  0.0  BASOSABS 0.0  --   --  0.0    Chemistries  Recent Labs  Lab 12/07/18 0320 12/08/18 0826  NA 133* 137  K 5.3* 5.0  CL 102 109  CO2 17* 16*  GLUCOSE 436* 294*  BUN 97* 98*  CREATININE 5.45* 5.48*  CALCIUM 8.4* 8.4*  AST 71*  --   ALT 87*  --   ALKPHOS 120  --   BILITOT 0.9  --    ------------------------------------------------------------------------------------------------------------------ estimated creatinine clearance is 9.8 mL/min (A) (by C-G formula based on SCr of 5.48 mg/dL (H)). ------------------------------------------------------------------------------------------------------------------ Recent Labs    12/07/18 0840  HGBA1C 6.8*   ------------------------------------------------------------------------------------------------------------------ No results for input(s): CHOL, HDL, LDLCALC, TRIG, CHOLHDL, LDLDIRECT in the last 72 hours. ------------------------------------------------------------------------------------------------------------------ Recent Labs    12/07/18 0840  TSH 0.977   ------------------------------------------------------------------------------------------------------------------ No results for input(s): VITAMINB12, FOLATE, FERRITIN, TIBC, IRON, RETICCTPCT in the last 72 hours.  Coagulation profile No results for input(s): INR, PROTIME in the last 168 hours.  No results for input(s): DDIMER in the last 72 hours.  Cardiac Enzymes No results for input(s): CKMB, TROPONINI, MYOGLOBIN in the last 168 hours.  Invalid input(s): CK ------------------------------------------------------------------------------------------------------------------ Invalid input(s): [place]   This is a 78 year old female admitted for sepsis.  1.  Sepsis: The patient meets criteria via tachypnea and  leukocytosis.   Due to E. coli 2.  Urinary tract infection: Continue ceftriaxone  3.  Acute kidney injury: Superimposed upon chronic kidney disease.  Continue IV fluids 4.  Anemia: Secondary to the above.    Patient being transfused  5.  Transaminitis: Repeat BMP in the morning 6.  Diabetes mellitus type 2: With hyperglycemia; secondary to infection.    I will start patient on low-dose Levemir 7.  CAD: Stable; continue Plavix 8.  Hypertension: Continue ARB. 9.    Renal cell mass felt to be due to renal cancer prognosis poor 10.    Dementia      Code Status Orders  (From admission, onward)         Start     Ordered   12/07/18 0825  Do not attempt resuscitation (DNR)  Continuous    Question Answer Comment  In the event of cardiac or respiratory ARREST Do not call a "code blue"   In the event of cardiac or respiratory ARREST Do not perform Intubation, CPR, defibrillation or ACLS   In the event of cardiac or respiratory ARREST Use medication by any route, position, wound care, and other measures to relive pain and suffering. May use oxygen, suction and manual treatment of airway obstruction as needed for comfort.      12/07/18 0824        Code Status History    This patient has a current code status but no historical code status.   Advance Care Planning Activity           Consults nephrology  DVT Prophylaxis  Lovenox  Lab Results  Component Value Date   PLT 358 12/08/2018     Time Spent in minutes 35 minutes greater than 50% of time spent in care coordination and counseling patient regarding the condition and plan of care.   Dustin Flock M.D on 12/08/2018 at 12:46 PM  Between 7am to 6pm - Pager - [phone]  After 6pm go to [email or web address] - Proofreader  [employer]   Office  [phone]

## 2018-12-08 NOTE — TOC Initial Note (Signed)
Transition of Care [facility]) - Initial/Assessment Note    Patient Details  Name: Michele Abbott MRN: [id number] Date of Birth: 04-May-1940  [facility] CM/SW Contact:    Shelbie Hutching, RN Phone Number: 12/08/2018, 1:56 PM  Clinical Narrative:                 Patient admitted to the hospital with encephalopathy and acute kidney injury.  Patient has a history of chronic kidney disease stage 4.  Patient is in need of dialysis and patient and family have decided not to precede with this.  Patient's son Michele Abbott is at the bedside and they have discussed with the doctor and with palliative care that they would like hospice services.  Patient qualifies for residential hospice.  Patient's son chooses hospice facility in [facility].  Flo Shanks with [facility] notified of residential hospice referral.   There are no residential hospice beds available today.   Expected Discharge Plan: [place] Barriers to Discharge: Hospice Bed not available   Patient Goals and CMS Choice Patient states their goals for this hospitalization and ongoing recovery are:: discharge to hospice home CMS Medicare.gov Compare Post Acute Care list provided to:: Patient Represenative (must comment)(patient's son Michele Abbott) Choice offered to / list presented to : Adult Children  Expected Discharge Plan and Services Expected Discharge Plan: [place]   Discharge Planning Services: CM Consult Post Acute Care Choice: Hospice                                        Prior Living Arrangements/Services     Patient language and need for interpreter reviewed:: No        Need for Family Participation in Patient Care: Yes (Comment)(Hospice Care) Care giver support system in place?: Yes (comment)(patient's son)   Criminal Activity/Legal Involvement Pertinent to Current Situation/Hospitalization: No - Comment as needed  Activities of Daily Living Home  Assistive Devices/Equipment: Wheelchair, Environmental consultant (specify type) ADL Screening (condition at time of admission) Patient's cognitive ability adequate to safely complete daily activities?: No Is the patient deaf or have difficulty hearing?: No Does the patient have difficulty seeing, even when wearing glasses/contacts?: No Does the patient have difficulty concentrating, remembering, or making decisions?: Yes Patient able to express need for assistance with ADLs?: No Does the patient have difficulty dressing or bathing?: Yes Independently performs ADLs?: No Communication: Independent Dressing (OT): Needs assistance Is this a change from baseline?: Pre-admission baseline Grooming: Needs assistance Is this a change from baseline?: Pre-admission baseline Feeding: Needs assistance Is this a change from baseline?: Pre-admission baseline Bathing: Independent, Needs assistance, Dependent Is this a change from baseline?: Pre-admission baseline Toileting: Independent In/Out Bed: Needs assistance Is this a change from baseline?: Pre-admission baseline Does the patient have difficulty walking or climbing stairs?: Yes Weakness of Legs: Both Weakness of Arms/Hands: Both  Permission Sought/Granted Permission sought to share information with : Case Manager, Chartered certified accountant granted to share information with : Yes, Verbal Permission Granted     Permission granted to share info w AGENCY: [facility]        Emotional Assessment Appearance:: Appears stated age       Alcohol / Substance Use: Not Applicable Psych Involvement: No (comment)  Admission diagnosis:  Encephalopathy [G93.40] Hyperglycemia [R73.9] Elevated BUN [R79.9] Hernia, abdominal [K46.9] Acute cystitis with hematuria [N30.01] Acute kidney injury  superimposed on chronic kidney disease ([place]) [N17.9, N18.9] Anemia, unspecified type [D64.9] AMS (altered mental status) [R41.82] Patient Active  Problem List   Diagnosis Date Noted  . AKI (acute kidney injury) ([place]) 12/07/2018  . Pelvic pain in female 11/23/2012  . Ventral hernia 11/11/2012  . Rectal pain 11/11/2012  . Hemorrhoid    PCP:  Cletis Athens, MD Pharmacy:   [place], [place] - [place] [address] Phone: [phone] Fax: [phone]  [place], [place] - [address] [place] [address] Phone: [phone] Fax: [phone]  [facility] - [place], [place] [address] Phone: [phone] Fax: [phone]     Social Determinants of Health (SDOH) Interventions    Readmission Risk Interventions No flowsheet data found.

## 2018-12-08 NOTE — Progress Notes (Signed)
Nutrition Brief Note  Patient identified to be seen for Malnutrition Screening Tool (MST). Chart reviewed. Patient now transitioning to comfort care.   No nutrition interventions warranted at this time. Please consult RD as needed.   Willey Blade, MS, [place], LDN Office: [phone] Pager: [phone] After Hours/Weekend Pager: [phone]

## 2018-12-08 NOTE — Progress Notes (Signed)
[facility]  ROUNDING NOTE   Subjective:  Patient seen at bedside. Appears to be resting comfortably. Awaiting further input from palliative care.   Objective:  Vital signs in last 24 hours:  Temp:  [98.1 F (36.7 C)-100.9 F (38.3 C)] 98.6 F (37 C) (09/22 0841) Pulse Rate:  [69-87] 69 (09/22 0841) Resp:  [17-28] 20 (09/22 0841) BP: (135-152)/(56-74) 142/65 (09/22 0841) SpO2:  [98 %-100 %] 100 % (09/22 0841)  Weight change:  Filed Weights   12/07/18 0306 12/07/18 0654  Weight: 98 kg 70.9 kg    Intake/Output: I/O last 3 completed shifts: In: 2431.2 [I.V.:1661.2; Blood:670; IV Piggyback:100] Out: 600 [Urine:600]   Intake/Output this shift:  No intake/output data recorded.  Physical Exam: General: No acute distress  Head: Normocephalic, atraumatic. Moist oral mucosal membranes  Eyes: Anicteric  Neck: Supple, trachea midline  Lungs:  Clear to auscultation, normal effort  Heart: S1S2 no rubs  Abdomen:  Soft, nontender, bowel sounds present  Extremities: Trace peripheral edema.  Neurologic: Lethargic but arousable  Skin: No lesions       Basic Metabolic Panel: Recent Labs  Lab 12/07/18 0320 12/08/18 0826  NA 133* 137  K 5.3* 5.0  CL 102 109  CO2 17* 16*  GLUCOSE 436* 294*  BUN 97* 98*  CREATININE 5.45* 5.48*  CALCIUM 8.4* 8.4*    Liver Function Tests: Recent Labs  Lab 12/07/18 0320  AST 71*  ALT 87*  ALKPHOS 120  BILITOT 0.9  PROT 6.5  ALBUMIN 2.4*   No results for input(s): LIPASE, AMYLASE in the last 168 hours. No results for input(s): AMMONIA in the last 168 hours.  CBC: Recent Labs  Lab 12/07/18 0320 12/07/18 1349 12/07/18 2216 12/08/18 0826  WBC 20.4*  --   --  21.1*  NEUTROABS 17.4*  --   --  18.3*  HGB 5.1* 6.3* 7.6* 7.9*  HCT 16.3* 19.3* 23.0* 23.8*  MCV 91.6  --   --  90.2  PLT 526*  --   --  358    Cardiac Enzymes: No results for input(s): CKTOTAL, CKMB, CKMBINDEX, TROPONINI in the last 168  hours.  BNP: Invalid input(s): POCBNP  CBG: Recent Labs  Lab 12/07/18 0815 12/07/18 1238 12/07/18 1906 12/07/18 2139 12/08/18 0719  GLUCAP 325* 343* 280* 284* 35*    Microbiology: Results for orders placed or performed during the hospital encounter of 12/07/18  Urine Culture     Status: Abnormal (Preliminary result)   Collection Time: 12/07/18  3:10 AM   Specimen: Urine, Catheterized  Result Value Ref Range Status   Specimen Description   Final    URINE, CATHETERIZED Performed at [facility], [address], [place], [place] [address]    Special Requests   Final    NONE Performed at [facility], [address] [place], [place] [address]    Culture (A)  Final    >=100,000 COLONIES/mL GRAM NEGATIVE RODS IDENTIFICATION AND SUSCEPTIBILITIES TO FOLLOW Performed at [facility], [place] [address] [place], [place] [address]    Report Status PENDING  Incomplete  SARS CORONAVIRUS 2 (TAT 6-24 HRS) Nasopharyngeal Nasopharyngeal Swab     Status: None   Collection Time: 12/07/18  5:33 AM   Specimen: Nasopharyngeal Swab  Result Value Ref Range Status   SARS Coronavirus 2 NEGATIVE NEGATIVE Final    Comment: (NOTE) SARS-CoV-2 target nucleic acids are NOT DETECTED. The SARS-CoV-2 RNA is generally detectable in upper and lower respiratory specimens during  the acute phase of infection. Negative results do not preclude SARS-CoV-2 infection, do not rule out co-infections with other pathogens, and should not be used as the sole basis for treatment or other patient management decisions. Negative results must be combined with clinical observations, patient history, and epidemiological information. The expected result is Negative. Fact Sheet for Patients: SugarRoll.be Fact Sheet for Healthcare Providers: https://www.woods-mathews.com/ This test is not yet approved or cleared by the [place] FDA and  has been  authorized for detection and/or diagnosis of SARS-CoV-2 by FDA under an Emergency Use Authorization (EUA). This EUA will remain  in effect (meaning this test can be used) for the duration of the COVID-19 declaration under Section 56 4(b)(1) of the Act, 21 U.S.C. section 360bbb-3(b)(1), unless the authorization is terminated or revoked sooner. Performed at [facility], [place] [address] [place], [place] [address]     Coagulation Studies: No results for input(s): LABPROT, INR in the last 72 hours.  Urinalysis: Recent Labs    12/07/18 0320  COLORURINE AMBER*  LABSPEC 1.009  PHURINE 6.0  GLUCOSEU >=500*  HGBUR SMALL*  BILIRUBINUR NEGATIVE  KETONESUR NEGATIVE  PROTEINUR 100*  NITRITE NEGATIVE  LEUKOCYTESUR MODERATE*      Imaging: Dg Chest 1 View  Result Date: 12/07/2018 CLINICAL DATA:  Altered mental status EXAM: CHEST  1 VIEW COMPARISON:  05/26/2013 FINDINGS: Chronic elevation of the left diaphragm with over lying presumed atelectasis. Normal heart size for technique. Low volume but clear right lung. No effusion or pneumothorax. IMPRESSION: Chronic elevation of the left diaphragm. No acute finding when compared to prior. Electronically Signed   By: Monte Fantasia M.D.   On: 12/07/2018 04:26   Dg Abd 1 View  Result Date: 12/07/2018 CLINICAL DATA:  Altered mental status EXAM: ABDOMEN - 1 VIEW COMPARISON:  Abdominal CT 11/17/2012 FINDINGS: Formed stool seen throughout most colonic segments. Apparent bulging of soft tissue density towards the air column in the right colon with a mottled appearance suggesting stool. Coarse calcification over the right upper quadrant which is dystrophic upper pole renal calcification by comparison CT. Superimposed cholecystectomy clips. There also dystrophic calcifications in the left upper pole kidney. Retrocardiac opacification that is dense. There is pending chest x-ray. IMPRESSION: Moderate distension of the colon by gas and stool. No evident  obstruction. Electronically Signed   By: Monte Fantasia M.D.   On: 12/07/2018 04:25   Ct Head Wo Contrast  Result Date: 12/07/2018 CLINICAL DATA:  Encephalopathy EXAM: CT HEAD WITHOUT CONTRAST TECHNIQUE: Contiguous axial images were obtained from the base of the skull through the vertex without intravenous contrast. COMPARISON:  05/26/2013 FINDINGS: Brain: No evidence of acute infarction, hemorrhage, hydrocephalus, extra-axial collection or mass lesion/mass effect. Chronic small vessel ischemia that is confluent in the deep cerebral white matter. Remote lacunar infarct in the left thalamus. Prominent cortical atrophy that is significantly progressed from 2015. Vascular: Atherosclerotic calcification Skull: No acute or aggressive finding Sinuses/Orbits: No acute finding IMPRESSION: 1. No acute finding. 2. Brain atrophy with significant progression from 2015. 3. Extensive chronic small vessel ischemia in the cerebral white matter Electronically Signed   By: Monte Fantasia M.D.   On: 12/07/2018 04:07     Medications:   . sodium chloride 75 mL/hr at 12/08/18 0000  . cefTRIAXone (ROCEPHIN)  IV 1 g (12/08/18 1022)   . Chlorhexidine Gluconate Cloth  6 each Topical Daily  . clopidogrel  75 mg Oral Daily  . docusate sodium  100 mg Oral BID  .  furosemide  20 mg Oral Daily  . insulin aspart  0-5 Units Subcutaneous QHS  . insulin aspart  0-9 Units Subcutaneous TID WC  . sodium bicarbonate  650 mg Oral BID  . vitamin B-12  1,000 mcg Oral Weekly  . [START ON 12/11/2018] Vitamin D (Ergocalciferol)  50,000 Units Oral Weekly   acetaminophen **OR** acetaminophen, ondansetron **OR** ondansetron (ZOFRAN) IV  Assessment/ Plan:  78 y.o. female with a PMHx of dementia, chronic kidney disease stage V followed by [facility] nephrology, diabetes mellitus type 2, GERD, CVA, anemia of chronic kidney disease, renal mass, who was admitted to [facility] on 12/07/2018 for evaluation of weakness, poor p.o. intake, and worsening  debility.  1.  Acute renal failure/chronic kidney disease stage V.  Patient continues to have very diminished renal function.  Creatinine 5.48 with an EGFR of 8.  Family has decided against dialysis.  Awaiting further palliative care input.  2.  Hyperkalemia.  Potassium down to 5.0.  3.  Anemia of chronic kidney disease.  Hemoglobin up to 7.9 posttransfusion.  Would not administer Epogen.  4.  Secondary hyperparathyroidism.  No need for calcitriol or phosphorus binders at this time.   LOS: 1 Elinor Kleine 9/22/202011:14 AM

## 2018-12-09 LAB — URINE CULTURE: Culture: >=100000 — AB

## 2018-12-09 MED ORDER — ACETAMINOPHEN 325 MG PO TABS: 650.0000 mg | ORAL_TABLET | Freq: Four times a day (QID) | ORAL | Status: AC | PRN

## 2018-12-09 MED ORDER — HYDROCODONE-ACETAMINOPHEN 5-325 MG PO TABS: 1.0000 | ORAL_TABLET | ORAL | 0 refills | Status: AC | PRN

## 2018-12-09 MED ORDER — HYDROCODONE-ACETAMINOPHEN 5-325 MG PO TABS: 1.0000 | ORAL_TABLET | ORAL | Status: DC | PRN

## 2018-12-09 MED ORDER — LORAZEPAM 1 MG PO TABS: 1.0000 mg | ORAL_TABLET | ORAL | 0 refills | Status: AC | PRN

## 2018-12-09 NOTE — TOC Transition Note (Signed)
Transition of Care [facility]) - CM/SW Discharge Note   Patient Details  Name: Michele Abbott MRN: [id number] Date of Birth: [birth year]  Transition of Care [facility]) CM/SW Contact:  Shelbie Hutching, RN Phone Number: 12/09/2018, 9:59 AM   Clinical Narrative:    Patient will discharge to residential hospice in [facility] at the [facility] today.  Son [name] is at the bedside.  Patient will transport via EMS.    Final next level of care: [place] Barriers to Discharge: Barriers Resolved   Patient Goals and CMS Choice Patient states their goals for this hospitalization and ongoing recovery are:: discharge to hospice home CMS Medicare.gov Compare Post Acute Care list provided to:: Patient Represenative (must comment)(patient's son [name]) Choice offered to / list presented to : Adult Children  Discharge Placement              Patient chooses bed at: [facility]) Patient to be transferred to facility by: [facility] Name of family member notified: Minela Bridgewater (son) Patient and family notified of of transfer: 12/09/18  Discharge Plan and Services   Discharge Planning Services: CM Consult Post Acute Care Choice: Hospice                               Social Determinants of Health (SDOH) Interventions     Readmission Risk Interventions No flowsheet data found.

## 2018-12-09 NOTE — Discharge Summary (Signed)
[facility] [place] [facility] [place], 78 y.o., DOB Oct 16, 1940, MRN [id number]. Admission date: 12/07/2018 Discharge Date 12/09/2018 Primary MD Cletis Athens, MD Admitting Physician Harrie Foreman, MD  Admission Diagnosis  Encephalopathy [G93.40] Hyperglycemia [R73.9] Elevated BUN [R79.9] Hernia, abdominal [K46.9] Acute cystitis with hematuria [N30.01] Acute kidney injury superimposed on chronic kidney disease ([place]) [N17.9, N18.9] Anemia, unspecified type [D64.9] AMS (altered mental status) [R41.82]  Discharge Diagnosis   Active Problems: Sepsis due to E. 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  Patient has been seen by [facility] nephrology and primary care provider.  She was recently noted to have a renal mass with concern for renal cancer.  However due to her dementia family did not want to pursue any further treatment.  Patient was admitted to the hospital with altered mental status.  Acute renal failure on chronic kidney disease.  She was noted to have a urinary tract infection with E. coli as well.  Further discussions were held with the son he stated that he did not want his mother to suffer any further.  And wanted her to be comfort measures with hospice.    Patient is being discharged to hospice home.        Consults  nephrology  Significant Tests:  See full reports for all details     Dg Chest 1 View  Result Date: 12/07/2018 CLINICAL DATA:  Altered mental status EXAM: CHEST  1 VIEW COMPARISON:  05/26/2013 FINDINGS: Chronic elevation of the left diaphragm with over lying presumed atelectasis. Normal heart size for technique.  Low volume but clear right lung. No effusion or pneumothorax. IMPRESSION: Chronic elevation of the left diaphragm. No acute finding when compared to prior. Electronically Signed   By: Monte Fantasia M.D.   On: 12/07/2018 04:26   Dg Abd 1 View  Result Date: 12/07/2018 CLINICAL DATA:  Altered mental status EXAM: ABDOMEN - 1 VIEW COMPARISON:  Abdominal CT 11/17/2012 FINDINGS: Formed stool seen throughout most colonic segments. Apparent bulging of soft tissue density towards the air column in the right colon with a mottled appearance suggesting stool. Coarse calcification over the right upper quadrant which is dystrophic upper pole renal calcification by comparison CT. Superimposed cholecystectomy clips. There also dystrophic calcifications in the left upper pole kidney. Retrocardiac opacification that is dense. There is pending chest x-ray. IMPRESSION: Moderate distension of the colon by gas and stool. No evident obstruction. Electronically Signed   By: Monte Fantasia M.D.   On: 12/07/2018 04:25   Ct Head Wo Contrast  Result Date: 12/07/2018 CLINICAL DATA:  Encephalopathy EXAM: CT HEAD WITHOUT CONTRAST TECHNIQUE: Contiguous axial images were obtained from the base of the skull through the vertex without intravenous contrast. COMPARISON:  05/26/2013 FINDINGS: Brain: No evidence of acute infarction, hemorrhage, hydrocephalus, extra-axial collection or mass lesion/mass effect. Chronic small vessel ischemia that is confluent in the deep cerebral white matter. Remote lacunar infarct in the left thalamus. Prominent cortical atrophy that is significantly progressed from 2015. Vascular: Atherosclerotic calcification Skull: No acute or aggressive finding Sinuses/Orbits: No acute finding IMPRESSION: 1. No acute finding. 2. Brain atrophy with significant progression from 2015. 3. Extensive chronic small vessel ischemia in the cerebral white matter Electronically Signed  By: Monte Fantasia M.D.   On: 12/07/2018 04:07        Today   Subjective:   Michele Abbott patient currently coughing not able to provide any review of system Objective:   Blood pressure 134/69, pulse 90, temperature (!) 100.7 F (38.2 C), temperature source Oral, resp. rate 20, height 5\' 11"  (1.803 m), weight 70.9 kg, SpO2 96 %.  .  Intake/Output Summary (Last 24 hours) at 12/09/2018 0812 Last data filed at 12/08/2018 1822 Gross per 24 hour  Intake 979.14 ml  Output 350 ml  Net 629.14 ml    Exam VITAL SIGNS: Blood pressure 134/69, pulse 90, temperature (!) 100.7 F (38.2 C), temperature source Oral, resp. rate 20, height 5\' 11"  (1.803 m), weight 70.9 kg, SpO2 96 %.  GENERAL:  78 y.o.-year-old patient lying in the bed with no acute distress.  EYES: Pupils equal, round, reactive to light and accommodation. No scleral icterus. Extraocular muscles intact.  HEENT: Head atraumatic, normocephalic. Oropharynx and nasopharynx clear.  NECK:  Supple, no jugular venous distention. No thyroid enlargement, no tenderness.  LUNGS: RHONCHUS BREATH SOUNDS  CARDIOVASCULAR: S1, S2 normal. No murmurs, rubs, or gallops.  ABDOMEN: Soft, nontender, nondistended. Bowel sounds present. No organomegaly or mass.  EXTREMITIES: No pedal edema, cyanosis, or clubbing.  NEUROLOGIC: Patient is drowsy PSYCHIATRIC: The patient is alert and oriented x 3.  SKIN: No obvious rash, lesion, or ulcer.   Data Review     CBC w Diff:  Lab Results  Component Value Date   WBC 21.1 (H) 12/08/2018   HGB 7.9 (L) 12/08/2018   HGB 11.7 (L) 05/26/2013   HCT 23.8 (L) 12/08/2018   HCT 36.8 05/26/2013   PLT 358 12/08/2018   PLT 263 05/26/2013   LYMPHOPCT 3 12/08/2018   LYMPHOPCT 37.1 10/06/2012   MONOPCT 8 12/08/2018   MONOPCT 8.8 10/06/2012   EOSPCT 0 12/08/2018   EOSPCT 13.6 10/06/2012   BASOPCT 0 12/08/2018   BASOPCT 1.1 10/06/2012   CMP:  Lab Results  Component Value Date   NA 137 12/08/2018   NA 140 05/26/2013   K 5.0 12/08/2018   K 4.2 05/26/2013    CL 109 12/08/2018   CL 104 05/26/2013   CO2 16 (L) 12/08/2018   CO2 29 05/26/2013   BUN 98 (H) 12/08/2018   BUN 14 05/26/2013   CREATININE 5.48 (H) 12/08/2018   CREATININE 1.08 05/26/2013   PROT 6.5 12/07/2018   PROT 6.7 05/26/2013   ALBUMIN 2.4 (L) 12/07/2018   ALBUMIN 3.3 (L) 05/26/2013   BILITOT 0.9 12/07/2018   BILITOT 0.5 05/26/2013   ALKPHOS 120 12/07/2018   ALKPHOS 72 05/26/2013   AST 71 (H) 12/07/2018   AST 5 (L) 05/26/2013   ALT 87 (H) 12/07/2018   ALT 9 (L) 05/26/2013  .  Micro Results Recent Results (from the past 240 hour(s))  Urine Culture     Status: Abnormal   Collection Time: 12/07/18  3:10 AM   Specimen: Urine, Catheterized  Result Value Ref Range Status   Specimen Description   Final    URINE, CATHETERIZED Performed at [facility], [address], [place], [place] [address]    Special Requests   Final    NONE Performed at [facility], [address] [place], [place] [address]    Culture >=100,000 COLONIES/mL ESCHERICHIA COLI (A)  Final   Report Status 12/09/2018 FINAL  Final   Organism ID, Bacteria ESCHERICHIA COLI (A)  Final  Susceptibility   Escherichia coli - MIC*    AMPICILLIN <=2 SENSITIVE Sensitive     CEFAZOLIN <=4 SENSITIVE Sensitive     CEFTRIAXONE <=1 SENSITIVE Sensitive     CIPROFLOXACIN >=4 RESISTANT Resistant     GENTAMICIN <=1 SENSITIVE Sensitive     IMIPENEM <=0.25 SENSITIVE Sensitive     NITROFURANTOIN <=16 SENSITIVE Sensitive     TRIMETH/SULFA <=20 SENSITIVE Sensitive     AMPICILLIN/SULBACTAM <=2 SENSITIVE Sensitive     PIP/TAZO <=4 SENSITIVE Sensitive     Extended ESBL NEGATIVE Sensitive     * >=100,000 COLONIES/mL ESCHERICHIA COLI  SARS CORONAVIRUS 2 (TAT 6-24 HRS) Nasopharyngeal Nasopharyngeal Swab     Status: None   Collection Time: 12/07/18  5:33 AM   Specimen: Nasopharyngeal Swab  Result Value Ref Range Status   SARS Coronavirus 2 NEGATIVE NEGATIVE Final    Comment: (NOTE) SARS-CoV-2  target nucleic acids are NOT DETECTED. The SARS-CoV-2 RNA is generally detectable in upper and lower respiratory specimens during the acute phase of infection. Negative results do not preclude SARS-CoV-2 infection, do not rule out co-infections with other pathogens, and should not be used as the sole basis for treatment or other patient management decisions. Negative results must be combined with clinical observations, patient history, and epidemiological information. The expected result is Negative. Fact Sheet for Patients: SugarRoll.be Fact Sheet for Healthcare Providers: https://www.woods-mathews.com/ This test is not yet approved or cleared by the [place] FDA and  has been authorized for detection and/or diagnosis of SARS-CoV-2 by FDA under an Emergency Use Authorization (EUA). This EUA will remain  in effect (meaning this test can be used) for the duration of the COVID-19 declaration under Section 56 4(b)(1) of the Act, 21 U.S.C. section 360bbb-3(b)(1), unless the authorization is terminated or revoked sooner. Performed at [facility] [place] [facility], [place] [address], [place], [place] [address]         Code Status Orders  (From admission, onward)         Start     Ordered   12/08/18 1337  Do not attempt resuscitation (DNR)  Continuous    Question Answer Comment  In the event of cardiac or respiratory ARREST Do not call a "code blue"   In the event of cardiac or respiratory ARREST Do not perform Intubation, CPR, defibrillation or ACLS   In the event of cardiac or respiratory ARREST Use medication by any route, position, wound care, and other measures to relive pain and suffering. May use oxygen, suction and manual treatment of airway obstruction as needed for comfort.   Comments MOST form in chart.      12/08/18 1338        Code Status History    Date Active Date Inactive Code Status Order ID Comments User Context   12/07/2018  0825 12/08/2018 1338 DNR [id number]  Harrie Foreman, MD Inpatient   Advance Care Planning Activity            Discharge Medications   Allergies as of 12/09/2018      Reactions   Codeine Other (See Comments)   shakey   Morphine Rash, Other (See Comments)      Medication List    STOP taking these medications   clopidogrel 75 MG tablet Commonly known as: PLAVIX   furosemide 20 MG tablet Commonly known as: LASIX   olmesartan 20 MG tablet Commonly known as: BENICAR   sodium bicarbonate 650 MG tablet   vitamin B-12 1000 MCG tablet Commonly  known as: CYANOCOBALAMIN   Vitamin D (Ergocalciferol) 1.25 MG (50000 UT) Caps capsule Commonly known as: DRISDOL     TAKE these medications   acetaminophen 325 MG tablet Commonly known as: TYLENOL Take 2 tablets (650 mg total) by mouth every 6 (six) hours as needed for mild pain (or Fever >/= 101).   HYDROcodone-acetaminophen 5-325 MG tablet Commonly known as: NORCO/VICODIN Take 1 tablet by mouth every 4 (four) hours as needed for moderate pain.   LORazepam 1 MG tablet Commonly known as: ATIVAN Take 1 tablet (1 mg total) by mouth every 4 (four) hours as needed for anxiety.          Total Time in preparing paper work, data evaluation and todays exam - 5 minutes  Dustin Flock M.D on 12/09/2018 at [place]  [phone]

## 2018-12-09 NOTE — Progress Notes (Signed)
There is a bed available for Michele Abbott at the hospice home today. Hospital care team updated. Writer met in the room with patient's son Michele Abbott to sign consents. All questions answered. Patient seen lying in bed, does open her eyes to name, but quickly closes them, did not eat this morning. Plan is for discharge to the hospice home today with a 5 pm EMS pick up scheduled. Report called to the hospice home. Discharge summary faxed to referral. Will continue to follow through discharge. Flo Shanks BSN, RN, [facility] [phone]

## 2018-12-09 NOTE — Progress Notes (Signed)
Patient transported to Hospice via EMS. Transport packet sent with EMS to hospice home. Family aware per Karen's notes. Stable at discharge.

## 2018-12-09 NOTE — Progress Notes (Signed)
[facility]  ROUNDING NOTE   Subjective:  Patient appears to be resting comfortably in bed. She does not appear to be in visible pain. Son at the bedside.   Objective:  Vital signs in last 24 hours:  Temp:  [99.6 F (37.6 C)-102.1 F (38.9 C)] 100.7 F (38.2 C) (09/23 0750) Pulse Rate:  [80-90] 90 (09/23 0750) Resp:  [16-40] 20 (09/23 0750) BP: (134-143)/(59-69) 134/69 (09/23 0750) SpO2:  [96 %-98 %] 96 % (09/23 0750)  Weight change:  Filed Weights   12/07/18 0306 12/07/18 0654  Weight: 98 kg 70.9 kg    Intake/Output: I/O last 3 completed shifts: In: 1488.4 [I.V.:1043.2; Blood:350; IV Piggyback:95.2] Out: 950 [Urine:950]   Intake/Output this shift:  No intake/output data recorded.  Physical Exam: General: No acute distress  Head: Normocephalic, atraumatic. Moist oral mucosal membranes  Eyes: Anicteric  Neck: Supple, trachea midline  Lungs:  Clear to auscultation, normal effort  Heart: S1S2 no rubs  Abdomen:  Soft, nontender, bowel sounds present  Extremities: Trace peripheral edema.  Neurologic: Lethargic but arousable  Skin: No lesions       Basic Metabolic Panel: Recent Labs  Lab 12/07/18 0320 12/08/18 0826  NA 133* 137  K 5.3* 5.0  CL 102 109  CO2 17* 16*  GLUCOSE 436* 294*  BUN 97* 98*  CREATININE 5.45* 5.48*  CALCIUM 8.4* 8.4*    Liver Function Tests: Recent Labs  Lab 12/07/18 0320  AST 71*  ALT 87*  ALKPHOS 120  BILITOT 0.9  PROT 6.5  ALBUMIN 2.4*   No results for input(s): LIPASE, AMYLASE in the last 168 hours. No results for input(s): AMMONIA in the last 168 hours.  CBC: Recent Labs  Lab 12/07/18 0320 12/07/18 1349 12/07/18 2216 12/08/18 0826  WBC 20.4*  --   --  21.1*  NEUTROABS 17.4*  --   --  18.3*  HGB 5.1* 6.3* 7.6* 7.9*  HCT 16.3* 19.3* 23.0* 23.8*  MCV 91.6  --   --  90.2  PLT 526*  --   --  358    Cardiac Enzymes: No results for input(s): CKTOTAL, CKMB, CKMBINDEX, TROPONINI in the last 168  hours.  BNP: Invalid input(s): POCBNP  CBG: Recent Labs  Lab 12/07/18 1906 12/07/18 2139 12/08/18 0719 12/08/18 1203 12/08/18 1629  GLUCAP 280* 284* 268* 232* 209*    Microbiology: Results for orders placed or performed during the hospital encounter of 12/07/18  Urine Culture     Status: Abnormal   Collection Time: 12/07/18  3:10 AM   Specimen: Urine, Catheterized  Result Value Ref Range Status   Specimen Description   Final    URINE, CATHETERIZED Performed at [facility], [address], [place], [place] [address]    Special Requests   Final    NONE Performed at [facility], [address] [place], [place] [address]    Culture >=100,000 COLONIES/mL ESCHERICHIA COLI (A)  Final   Report Status 12/09/2018 FINAL  Final   Organism ID, Bacteria ESCHERICHIA COLI (A)  Final      Susceptibility   Escherichia coli - MIC*    AMPICILLIN <=2 SENSITIVE Sensitive     CEFAZOLIN <=4 SENSITIVE Sensitive     CEFTRIAXONE <=1 SENSITIVE Sensitive     CIPROFLOXACIN >=4 RESISTANT Resistant     GENTAMICIN <=1 SENSITIVE Sensitive     IMIPENEM <=0.25 SENSITIVE Sensitive     NITROFURANTOIN <=16 SENSITIVE Sensitive     TRIMETH/SULFA <=20 SENSITIVE Sensitive  AMPICILLIN/SULBACTAM <=2 SENSITIVE Sensitive     PIP/TAZO <=4 SENSITIVE Sensitive     Extended ESBL NEGATIVE Sensitive     * >=100,000 COLONIES/mL ESCHERICHIA COLI  SARS CORONAVIRUS 2 (TAT 6-24 HRS) Nasopharyngeal Nasopharyngeal Swab     Status: None   Collection Time: 12/07/18  5:33 AM   Specimen: Nasopharyngeal Swab  Result Value Ref Range Status   SARS Coronavirus 2 NEGATIVE NEGATIVE Final    Comment: (NOTE) SARS-CoV-2 target nucleic acids are NOT DETECTED. The SARS-CoV-2 RNA is generally detectable in upper and lower respiratory specimens during the acute phase of infection. Negative results do not preclude SARS-CoV-2 infection, do not rule out co-infections with other pathogens, and should not be used  as the sole basis for treatment or other patient management decisions. Negative results must be combined with clinical observations, patient history, and epidemiological information. The expected result is Negative. Fact Sheet for Patients: SugarRoll.be Fact Sheet for Healthcare Providers: https://www.woods-mathews.com/ This test is not yet approved or cleared by the [place] FDA and  has been authorized for detection and/or diagnosis of SARS-CoV-2 by FDA under an Emergency Use Authorization (EUA). This EUA will remain  in effect (meaning this test can be used) for the duration of the COVID-19 declaration under Section 56 4(b)(1) of the Act, 21 U.S.C. section 360bbb-3(b)(1), unless the authorization is terminated or revoked sooner. Performed at [facility], [place] [address] [place], [place] [address]     Coagulation Studies: No results for input(s): LABPROT, INR in the last 72 hours.  Urinalysis: Recent Labs    12/07/18 0320  COLORURINE AMBER*  LABSPEC 1.009  PHURINE 6.0  GLUCOSEU >=500*  HGBUR SMALL*  BILIRUBINUR NEGATIVE  KETONESUR NEGATIVE  PROTEINUR 100*  NITRITE NEGATIVE  LEUKOCYTESUR MODERATE*      Imaging: No results found.   Medications:   . sodium chloride Stopped (12/08/18 1446)   . Chlorhexidine Gluconate Cloth  6 each Topical Daily  . docusate sodium  100 mg Oral BID  . furosemide  20 mg Oral Daily  . sodium chloride flush  3 mL Intravenous Q12H   acetaminophen **OR** acetaminophen, antiseptic oral rinse, fentaNYL (SUBLIMAZE) injection, glycopyrrolate **OR** glycopyrrolate **OR** glycopyrrolate, haloperidol **OR** haloperidol **OR** haloperidol lactate, HYDROcodone-acetaminophen, LORazepam **OR** LORazepam **OR** LORazepam, ondansetron **OR** ondansetron (ZOFRAN) IV, polyvinyl alcohol, sodium chloride flush  Assessment/ Plan:  78 y.o. female with a PMHx of dementia, chronic kidney disease stage V  followed by [facility] nephrology, diabetes mellitus type 2, GERD, CVA, anemia of chronic kidney disease, renal mass, who was admitted to [facility] on 12/07/2018 for evaluation of weakness, poor p.o. intake, and worsening debility.  1.  Acute renal failure/chronic kidney disease stage V.  Family decided against renal replacement therapy which is certainly reasonable given her underlying comorbidities.  2.  Hyperkalemia.  No further treatment for this necessary.  3.  Anemia of chronic kidney disease.  Patient was transfused this admission but no further transfusions planned.  4.  Secondary hyperparathyroidism.  No need for calcitriol or phosphorus binders at this time.   LOS: 2 Raymund Manrique 9/23/202011:03 AM

## 2019-01-17 DEATH — deceased
